# Patient Record
Sex: Male | Born: 1985 | Race: White | Hispanic: No | Marital: Single | Smoking: Never smoker
Health system: Southern US, Community
[De-identification: ages and names within clinical notes are randomized; demographics above are authoritative.]

## PROBLEM LIST (undated history)

## (undated) DIAGNOSIS — F32A Depression, unspecified: Secondary | ICD-10-CM

## (undated) DIAGNOSIS — F329 Major depressive disorder, single episode, unspecified: Secondary | ICD-10-CM

## (undated) HISTORY — DX: Major depressive disorder, single episode, unspecified: F32.9

## (undated) HISTORY — DX: Depression, unspecified: F32.A

---

## 2009-04-27 ENCOUNTER — Ambulatory Visit: Payer: Self-pay | Admitting: Family Medicine

## 2009-05-01 ENCOUNTER — Encounter: Payer: Self-pay | Admitting: Family Medicine

## 2009-05-06 ENCOUNTER — Encounter: Payer: Self-pay | Admitting: Family Medicine

## 2009-06-05 ENCOUNTER — Encounter: Payer: Self-pay | Admitting: Family Medicine

## 2011-09-06 HISTORY — PX: KNEE SURGERY: SHX244

## 2011-12-22 ENCOUNTER — Ambulatory Visit: Payer: Self-pay | Admitting: Specialist

## 2012-03-28 ENCOUNTER — Ambulatory Visit: Payer: Self-pay | Admitting: Specialist

## 2014-12-23 NOTE — Op Note (Signed)
PATIENT NAME:  [name], David R MR#:  Foster DATE OF BIRTH:  [birth year]  DATE OF PROCEDURE:  03/28/2012  PREOPERATIVE DIAGNOSIS: Medial meniscus tear, left knee.   POSTOPERATIVE DIAGNOSIS: Bucket-handle tear medial meniscus, left knee.   PROCEDURE: Arthroscopic partial medial meniscectomy, left knee.   SURGEON: Clare Gandyhristopher E. Reggie Bise, MD  ANESTHESIA: General.   COMPLICATIONS: None.   DESCRIPTION OF PROCEDURE: After adequate induction of general anesthesia, the left lower extremity is placed in the legholder and secured in the usual manner for knee arthroscopy. The left knee and leg are thoroughly prepped with alcohol and ChloraPrep and draped in standard sterile fashion. The joint is infiltrated with 10 mL of Marcaine with epinephrine. Diagnostic arthroscopy is performed. The patellofemoral articulation is normal. The lateral compartment is normal. The anterior cruciate ligament is normal. The pathology is confined to the medial compartment. There is seen to be a bucket-handle type tear of the medial meniscus with the torn portion sitting in the intercondylar notch. The posterior attachment of the bucket handle portion is incised and then the anterior portion is incised. The piece is then grasped with a grasper and removed. The full radial resector and the ArthroWand are then used to smooth off the points of attachment both anteriorly and posteriorly. The joint is thoroughly irrigated multiple times. Skin edges are closed with 4-0 nylon. The joint is infiltrated with 20 mL of Marcaine with epinephrine with 4 mg of morphine. Soft bulky dressing is applied. Patient is returned to the recovery room in satisfactory condition having tolerated the procedure quite well.    ____________________________ Clare Gandyhristopher E. Jess Sulak, MD ces:cms D: 03/28/2012 12:29:33 ET T: 03/28/2012 12:37:33 ET JOB#: [id number] cc: Clare Gandyhristopher E. Yuvraj Pfeifer, MD, <Dictator> Clare GandyHRISTOPHER E Carlous Olivares MD ELECTRONICALLY SIGNED 03/29/2012  14:16

## 2015-02-24 ENCOUNTER — Encounter: Payer: Self-pay | Admitting: Family Medicine

## 2015-03-25 ENCOUNTER — Ambulatory Visit (INDEPENDENT_AMBULATORY_CARE_PROVIDER_SITE_OTHER): Payer: BLUE CROSS/BLUE SHIELD | Admitting: Family Medicine

## 2015-03-25 ENCOUNTER — Ambulatory Visit
Admission: RE | Admit: 2015-03-25 | Discharge: 2015-03-25 | Disposition: A | Discharge status: Home / Self Care | Payer: BLUE CROSS/BLUE SHIELD | Source: Ambulatory Visit | Attending: Family Medicine | Admitting: Family Medicine

## 2015-03-25 ENCOUNTER — Encounter: Payer: Self-pay | Admitting: Family Medicine

## 2015-03-25 VITALS — BP 128/88 | HR 85 | Temp 98.6°F | Resp 16 | Ht 72.0 in | Wt 201.2 lb

## 2015-03-25 DIAGNOSIS — Z0000 Encounter for general adult medical examination without abnormal findings: Secondary | ICD-10-CM | POA: Diagnosis not present

## 2015-03-25 DIAGNOSIS — M545 Low back pain: Secondary | ICD-10-CM | POA: Diagnosis not present

## 2015-03-25 DIAGNOSIS — M542 Cervicalgia: Secondary | ICD-10-CM | POA: Diagnosis not present

## 2015-03-25 DIAGNOSIS — M7918 Myalgia, other site: Secondary | ICD-10-CM | POA: Insufficient documentation

## 2015-03-25 DIAGNOSIS — G8929 Other chronic pain: Secondary | ICD-10-CM | POA: Insufficient documentation

## 2015-03-25 DIAGNOSIS — M546 Pain in thoracic spine: Secondary | ICD-10-CM | POA: Diagnosis not present

## 2015-03-25 MED ORDER — CYCLOBENZAPRINE HCL 5 MG PO TABS: 5.0000 mg | ORAL_TABLET | Freq: Three times a day (TID) | ORAL | Status: DC | PRN

## 2015-03-25 MED ORDER — MELOXICAM 15 MG PO TABS: 15.0000 mg | ORAL_TABLET | Freq: Every day | ORAL | Status: DC

## 2015-03-25 NOTE — Patient Instructions (Signed)
Follow up in one month.

## 2015-03-25 NOTE — Progress Notes (Signed)
Name: David Foster   MRN: [id number]    DOB: February 19, 1986   Date:03/25/2015       Progress Note  Subjective  Chief Complaint  Chief Complaint  Patient presents with  . Annual Exam    Back Pain The current episode started more than 1 month ago. The problem occurs constantly. The problem has been gradually worsening since onset. The pain is present in the thoracic spine, sacro-iliac and lumbar spine (Cervical pain). The quality of the pain is described as aching. The pain is moderate. The pain is worse during the day. The symptoms are aggravated by bending, position and twisting (Lifting). Pertinent negatives include no chest pain, dysuria, fever, headaches, tingling, weakness or weight loss. He has tried analgesics (Chiropractor and PT in the past) for the symptoms.    Patient presents for annual H&P  Past Medical History  Diagnosis Date  . Depression     History  Substance Use Topics  . Smoking status: Never Smoker   . Smokeless tobacco: Not on file  . Alcohol Use: No     Current outpatient prescriptions:  .  saw palmetto 80 MG capsule, Take 80 mg by mouth 2 (two) times daily., Disp: , Rfl:   No Known Allergies  Review of Systems  Constitutional: Negative for fever, chills and weight loss.  HENT: Negative for congestion, hearing loss, sore throat and tinnitus.   Eyes: Negative for blurred vision, double vision and redness.  Respiratory: Negative for cough, hemoptysis and shortness of breath.   Cardiovascular: Negative for chest pain, palpitations, orthopnea, claudication and leg swelling.  Gastrointestinal: Negative for heartburn, nausea, vomiting, diarrhea, constipation and blood in stool.  Genitourinary: Negative for dysuria, urgency, frequency and hematuria.  Musculoskeletal: Positive for back pain. Negative for myalgias, joint pain, falls and neck pain.       Cervical thoracic and lumbar pain which is made worse by lifting at work  Skin: Negative for itching.   Neurological: Negative for dizziness, tingling, tremors, focal weakness, seizures, loss of consciousness, weakness and headaches.  Endo/Heme/Allergies: Does not bruise/bleed easily.  Psychiatric/Behavioral: Negative for depression and substance abuse. The patient is not nervous/anxious and does not have insomnia.      Objective  Filed Vitals:   03/25/15 1013  BP: 128/88  Pulse: 85  Temp: 98.6 F (37 C)  Resp: 16  Height: 6' (1.829 m)  Weight: 201 lb 4 oz (91.286 kg)  SpO2: 97%     Physical Exam  Constitutional: He is oriented to person, place, and time and well-developed, well-nourished, and in no distress.  HENT:  Head: Normocephalic.  Eyes: EOM are normal. Pupils are equal, round, and reactive to light.  Neck: Normal range of motion. Neck supple. No thyromegaly present.  Cardiovascular: Normal rate, regular rhythm and normal heart sounds.   No murmur heard. Pulmonary/Chest: Effort normal and breath sounds normal. No respiratory distress. He has no wheezes.  Abdominal: Soft. Bowel sounds are normal.  Genitourinary: Penis normal.  Musculoskeletal: He exhibits no edema.  Tender along the cervical thoracic and lumbar areas before range of motion sidebending about 45 reproduces some discomfort in the thoracic and lumbar area.  Lymphadenopathy:    He has no cervical adenopathy.  Neurological: He is alert and oriented to person, place, and time. No cranial nerve deficit. Gait normal. Coordination normal.  Skin: Skin is warm and dry. No rash noted.  Psychiatric: Affect and judgment normal.      Assessment & Plan  1. Annual physical  exam  - CBC with Differential/Platelet - Comprehensive metabolic panel - Lipid panel - TSH  2. Cervical pain (neck) Long-standing - DG Cervical Spine Complete; Future - meloxicam (MOBIC) 15 MG tablet; Take 1 tablet (15 mg total) by mouth daily.  Dispense: 30 tablet; Refill: 0 - cyclobenzaprine (FLEXERIL) 5 MG tablet; Take 1 tablet (5 mg  total) by mouth 3 (three) times daily as needed for muscle spasms.  Dispense: 30 tablet; Refill: 1  3. Bilateral thoracic back pain Long-standing  - DG Thoracic Spine W/Swimmers; Future  4. Lumbar muscle pain Long-standing - DG Lumbar Spine Complete; Future - Ambulatory referral to Physical Therapy

## 2015-03-26 ENCOUNTER — Ambulatory Visit
Admission: RE | Admit: 2015-03-26 | Discharge: 2015-03-26 | Disposition: A | Discharge status: Home / Self Care | Payer: BLUE CROSS/BLUE SHIELD | Source: Ambulatory Visit | Attending: Family Medicine | Admitting: Family Medicine

## 2015-03-26 DIAGNOSIS — M545 Low back pain: Secondary | ICD-10-CM | POA: Insufficient documentation

## 2015-03-26 DIAGNOSIS — M542 Cervicalgia: Secondary | ICD-10-CM | POA: Insufficient documentation

## 2015-03-26 DIAGNOSIS — M549 Dorsalgia, unspecified: Secondary | ICD-10-CM | POA: Diagnosis present

## 2015-03-26 DIAGNOSIS — M546 Pain in thoracic spine: Secondary | ICD-10-CM | POA: Insufficient documentation

## 2015-03-26 DIAGNOSIS — M7918 Myalgia, other site: Secondary | ICD-10-CM

## 2015-03-26 LAB — LIPID PANEL
CHOLESTEROL TOTAL: 161 mg/dL (ref 100–199)
Chol/HDL Ratio: 3.8 ratio units (ref 0.0–5.0)
HDL: 42 mg/dL (ref >39)
LDL Calculated: 109 mg/dL — ABNORMAL HIGH (ref 0–99)
TRIGLYCERIDES: 52 mg/dL (ref 0–149)
VLDL Cholesterol Cal: 10 mg/dL (ref 5–40)

## 2015-03-26 LAB — COMPREHENSIVE METABOLIC PANEL
A/G RATIO: 2.4 (ref 1.1–2.5)
ALT: 25 IU/L (ref 0–44)
AST: 21 IU/L (ref 0–40)
Albumin: 4.8 g/dL (ref 3.5–5.5)
Alkaline Phosphatase: 43 IU/L (ref 39–117)
BUN / CREAT RATIO: 16 (ref 8–19)
BUN: 16 mg/dL (ref 6–20)
Bilirubin Total: 0.8 mg/dL (ref 0.0–1.2)
CALCIUM: 9.6 mg/dL (ref 8.7–10.2)
CO2: 26 mmol/L (ref 18–29)
Chloride: 99 mmol/L (ref 97–108)
Creatinine, Ser: 0.98 mg/dL (ref 0.76–1.27)
GFR, EST AFRICAN AMERICAN: 120 mL/min/{1.73_m2} (ref >59)
GFR, EST NON AFRICAN AMERICAN: 104 mL/min/{1.73_m2} (ref >59)
GLOBULIN, TOTAL: 2 g/dL (ref 1.5–4.5)
Glucose: 83 mg/dL (ref 65–99)
POTASSIUM: 4.9 mmol/L (ref 3.5–5.2)
Sodium: 141 mmol/L (ref 134–144)
Total Protein: 6.8 g/dL (ref 6.0–8.5)

## 2015-03-26 LAB — CBC WITH DIFFERENTIAL/PLATELET
BASOS ABS: 0 10*3/uL (ref 0.0–0.2)
Basos: 1 %
EOS (ABSOLUTE): 0.2 10*3/uL (ref 0.0–0.4)
Eos: 3 %
HEMOGLOBIN: 16.1 g/dL (ref 12.6–17.7)
Hematocrit: 46 % (ref 37.5–51.0)
IMMATURE GRANS (ABS): 0 10*3/uL (ref 0.0–0.1)
IMMATURE GRANULOCYTES: 0 %
Lymphocytes Absolute: 1.7 10*3/uL (ref 0.7–3.1)
Lymphs: 34 %
MCH: 30.4 pg (ref 26.6–33.0)
MCHC: 35 g/dL (ref 31.5–35.7)
MCV: 87 fL (ref 79–97)
MONOCYTES: 8 %
Monocytes Absolute: 0.4 10*3/uL (ref 0.1–0.9)
NEUTROS PCT: 54 %
Neutrophils Absolute: 2.7 10*3/uL (ref 1.4–7.0)
Platelets: 223 10*3/uL (ref 150–379)
RBC: 5.3 x10E6/uL (ref 4.14–5.80)
RDW: 13.2 % (ref 12.3–15.4)
WBC: 4.9 10*3/uL (ref 3.4–10.8)

## 2015-03-26 LAB — TSH: TSH: 1.57 u[IU]/mL (ref 0.450–4.500)

## 2015-03-31 ENCOUNTER — Telehealth: Payer: Self-pay | Admitting: Emergency Medicine

## 2015-03-31 NOTE — Telephone Encounter (Signed)
Patient notified

## 2015-03-31 NOTE — Telephone Encounter (Signed)
Patient notified of lab results and will call back in 2 weeks if he feels he needs a referral to Ortho

## 2015-04-01 ENCOUNTER — Encounter: Payer: Self-pay | Admitting: Physical Therapy

## 2015-04-01 ENCOUNTER — Ambulatory Visit: Payer: BLUE CROSS/BLUE SHIELD | Attending: Family Medicine | Admitting: Physical Therapy

## 2015-04-01 DIAGNOSIS — M542 Cervicalgia: Secondary | ICD-10-CM | POA: Insufficient documentation

## 2015-04-01 DIAGNOSIS — R29898 Other symptoms and signs involving the musculoskeletal system: Secondary | ICD-10-CM | POA: Diagnosis not present

## 2015-04-01 DIAGNOSIS — M549 Dorsalgia, unspecified: Secondary | ICD-10-CM

## 2015-04-01 DIAGNOSIS — M546 Pain in thoracic spine: Secondary | ICD-10-CM | POA: Diagnosis not present

## 2015-04-01 NOTE — Patient Instructions (Addendum)
(  Home) Retraction: Row - Bilateral (Anchor)   Facing anchor, arms reaching forward, pull hands toward stomach, pinching shoulder blades together. Repeat _10 ___ times per set. Do __2__ sets per session. Do __5 __ sessions per week. Use _red theraband ___ Copyright  VHI. All rights reserved.  Axial Extension (Chin Tuck)   Pull chin in and lengthen back of neck. Hold __3__ seconds while counting out loud. Repeat __10 __ times. Do _3___ sessions per day.  http://gt2.exer.us/449   Copyright  VHI. All rights reserved.  (Home) Extension / Flexion (Assist)   Face anchor, right arm as far forward and up as is pain free. Pull arm down toward side. Repeat __10__ times per set. Do __2__ sets per session. Do _5___ sessions per week. Use ____ lb weights.  Copyright  VHI. All rights reserved.  CHEST: Doorway, Bilateral - Standing   Standing in doorway, place hands on wall with elbows bent at shoulder height. Lean forward. Hold _10__ seconds. _4__ reps per set, __3_ sets per day, __5_ days per week  Copyright  VHI. All rights reserved.

## 2015-04-01 NOTE — Therapy (Signed)
[facility] MAIN [facility] SERVICES [address] [place], [place], [address] Phone: [phone]   Fax:  [phone]  Physical Therapy Evaluation  Patient Details  Name: David Foster MRN: [id number] Date of Birth: 1986-06-17 Referring Provider:  Dennison Mascot, MD  Encounter Date: 04/01/2015      PT End of Session - 04/01/15 1249    Visit Number 1   Number of Visits 9   Date for PT Re-Evaluation 04/29/15   PT Start Time 1102   PT Stop Time 1200   PT Time Calculation (min) 58 min   Activity Tolerance Patient tolerated treatment well   Behavior During Therapy [facility] for tasks assessed/performed      Past Medical History  Diagnosis Date  . Depression     Past Surgical History  Procedure Laterality Date  . Knee surgery Left 2013    There were no vitals filed for this visit.  Visit Diagnosis:  Neck pain on right side - Plan: PT plan of care cert/re-cert  Upper back pain on right side - Plan: PT plan of care cert/re-cert  Shoulder weakness - Plan: PT plan of care cert/re-cert      Subjective Assessment - 04/01/15 1111    Subjective Patient is a pleasant 29 year old male; reports that he has a history of back problems, usually in low back. This episode, patient has experienced upper thoracic pain and neck pain, states that he received chiropractic treatment about 3 weeks ago. Since chiropractic treatment, pain has decreased, but still problematic, especially at the end of the day when he is reaching out and overhead.   Pertinent History History of low back pain. Family history of back problems in father.    Limitations Lifting;Walking   How long can you sit comfortably? As long as needed   How long can you stand comfortably? as long as needed, provided no heavy lifting.    How long can you walk comfortably? as long as needed, provided no heavy lifting.    Diagnostic tests X-ray. Mild degenerative changes, no other abnormalities noted.     Patient Stated Goals reduce pain, increase function at work.    Currently in Pain? Yes   Pain Score 1    Pain Location Back   Pain Descriptors / Indicators Aching;Stabbing;Sore   Pain Type Chronic pain   Pain Radiating Towards n/a   Pain Onset More than a month ago   Pain Frequency Intermittent   Aggravating Factors  Lifting out or overhead , turning head to right    Pain Relieving Factors rest, sleeping on back or side   Effect of Pain on Daily Activities Reduced function at end of day    Multiple Pain Sites No            [facility] PT Assessment - 04/02/15 0001    Assessment   Medical Diagnosis Thoracic back pain    Onset Date/Surgical Date 04/01/15   Hand Dominance Right   Prior Therapy PT for lower back problems in approx 2009; good results from that treatment; Chiropractor treatment 7/12, some relief    Home Environment   Living Environment Private residence   Living Arrangements Alone   Available Help at Discharge Friend(s);Family   Type of Home House   Home Access Stairs to enter   Entrance Stairs-Number of Steps 4   Entrance Stairs-Rails Right;Left;Can reach both   Home Layout One level   Prior Function   Level of Independence Independent  Vocation Full time employment  Working full time at [employer] and part time [employer]    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 Also sits in flexed poster when not instructed for erect posture.    AROM   Overall AROM Comments Cervical flexion 45: cervical extension: 55:  R cervical sidebend 45, L cervical sidebend 50.  All UE motion within normal limits, no winging of scapula with AROM. Gross thoracic and lumbar extension mild reduced motion.    Strength   Overall Strength Comments R shoulder flexion 4+/5 with winging scapula noted, winging not present on L. decreased strength with resisted rotation to L. R IR 5/5 with winging scapula. R rhomboid strength 4/5, R low trapezius 4/5. L rhomboid 4+/5, L low trap 4+/5. All other UE motions 5/5    Palpation   Palpation comment reports increased pain with palpation to Rhomboids, and to PA spinal mobilizations C6-T6, most tender C7 and T4, tenderness also noted with bilateral rotational mobs T1-T5   Spurling's   Findings Positive   Side Right   Comment Pain in R T2-T3 facet region, no sensation reported in arm of shoulder.    Ambulation/Gait   Gait Comments patient ambulates with normal reciprocal gait pattern, but forward head noted .           Treatment :   Educated in [employer] tucks 2x10  Shoulder extension red tband  x10  low row x10  PT provided Verbal instruction to increase scapular retraction and prevent shoulder shrug, as well as to prevent cervical extension with chin tucks.               PT Education - 04/01/15 1604    Education provided Yes   Education Details Plan of care, HEP - see patient instructions    Person(s) Educated Patient   Methods Explanation;Demonstration;Verbal cues   Comprehension Verbalized understanding;Returned demonstration;Verbal cues required             PT Long Term Goals - 04/01/15 1609    PT LONG TERM GOAL #1   Title Patient will be independent with HEP to improve strength and decrease pain at work by 04/29/2015   Time 4   Period Weeks   Status New   PT LONG TERM GOAL #2   Title Improve Modified ODI by > 6 points to indicate decreased effect of back pain on daily life as well as improved function by 04/29/2015   Baseline 18   Time 4   Period  Weeks   Status New   PT LONG TERM GOAL #3   Title Patient will increase all upper extremity strengthening to 5/5 to allow greater function at work and decreased pain by 04/29/2015   Time 4   Period Weeks   Status New   PT LONG TERM GOAL #4   Title Patient will be able to move box up to 100 lbs without increase in pain to allow improved function at work by 04/29/2015   Time 4   Period Weeks   Status  New   PT LONG TERM GOAL #5   Title Patient will demonstrate full equal bilateral thoracic and cervical ROM to allow improved function to move heavy boxes at work by 04/29/2015   Time 4   Period Weeks   Status New               Plan - 04/01/15 1249    Clinical Impression Statement Patient is a pleasant 29 year old male with complaints of upper back pain. Patient demonstrates slight impairment due to back pain as evidenced by 18%(mild impairment) on the modified ODI. Through evaluation patient found to have decreased strength in parascapular musculature and resisted trunk rotation to L. Increase pain noted in R paraspinal/T2-T3 facet region with cervical rotation as well as middle trapezius MMT, shoulder flexion MMT and rhomboid MMT. Patient also reports increased pain in R rhomboid region with PA and rotational mobs to lower cervical spine and upper/Mid thoracic spine, as well as hypomobililty from C7-T5. Trigger point noted in R rhomboid region. Positive spurling test, but no pain distal upper back. Patient also demonstrates poor postural control in sitting and standing with forward head and rounded shoulders. Patient would benefit from skilled PT in order to reduce pain and increase function at work.   Pt will benefit from skilled therapeutic intervention in order to improve on the following deficits Hypomobility;Decreased activity tolerance;Decreased range of motion;Decreased strength;Postural dysfunction;Improper body mechanics   Rehab Potential Excellent   Clinical Impairments Affecting  Rehab Potential Positive: young in age, High prior level of function and recent onset. Negative, poor posture, recurrent back problems   PT Frequency 2x / week   PT Duration 4 weeks   PT Treatment/Interventions Cryotherapy;Moist Heat;Traction;Ultrasound;Electrical Stimulation;Stair training;Functional mobility training;Therapeutic exercise;Patient/family education;Manual techniques;Passive range of motion;Energy conservation;Therapeutic activities;Dry needling;Taping   PT Next Visit Plan cervical ROM postural exercises, manual therapy    PT Home Exercise Plan see patient instructions.    Consulted and Agree with Plan of Care Patient         Problem List Patient Active Problem List   Diagnosis Date Noted  . Annual physical exam 03/25/2015  . Cervical pain (neck) 03/25/2015  . Lumbar muscle pain 03/25/2015  . Bilateral thoracic back pain 03/25/2015   Grier Rocher SPT 04/02/2015   10:13 AM  This entire session was performed under direct supervision and direction of a licensed therapist . I have personally read, edited and approve of the note as written.  Hopkins,Margaret, PT, DPT 04/02/2015, 10:13 AM  [facility] MAIN [facility] SERVICES [address] [place], [place], [address] Phone: [phone]   Fax:  [phone]

## 2015-04-08 ENCOUNTER — Encounter: Payer: Self-pay | Admitting: Physical Therapy

## 2015-04-08 ENCOUNTER — Ambulatory Visit: Payer: BLUE CROSS/BLUE SHIELD | Attending: Family Medicine | Admitting: Physical Therapy

## 2015-04-08 DIAGNOSIS — M542 Cervicalgia: Secondary | ICD-10-CM | POA: Diagnosis not present

## 2015-04-08 DIAGNOSIS — M546 Pain in thoracic spine: Secondary | ICD-10-CM | POA: Diagnosis present

## 2015-04-08 DIAGNOSIS — R29898 Other symptoms and signs involving the musculoskeletal system: Secondary | ICD-10-CM | POA: Diagnosis present

## 2015-04-08 DIAGNOSIS — M549 Dorsalgia, unspecified: Secondary | ICD-10-CM

## 2015-04-08 NOTE — Patient Instructions (Signed)
  Shoulder Retraction   Facing chest height anchor, grasp ends of band and pull hands to chest, squeezing shoulder blades together. Hold __2_ seconds. Repeat 10___ times. Do _2__ sessions per day. Safety Note: Be sure anchor is secure.  Copyright  VHI. All rights reserved.  Shoulder Extension (Sit Box)   Tie red tband around doorknob. Holding band in each hand, pull both arms back, squeezing shoulder blades. Repeat 10 times, 2sets x5 days a week;  Copyright  VHI. All rights reserved.  Copyright  VHI. All rights reserved.  Reverse Fly / Shoulder Retraction  Use red band Extend both arms in front of body at shoulder height, palms down, holding band. Move arms out to sides, squeeze shoulder blades together. Repeat _10__ times. Do _2__ sessions per day.   Copyright  VHI. All rights reserved.   Roll   Inhale and bring shoulders up, back, then exhale and relax shoulders down. Repeat _10__ times. Do _2__ times per day.  Copyright  VHI. All rights reserved.

## 2015-04-08 NOTE — Therapy (Signed)
[facility] MAIN [facility] SERVICES [address] [place], [place], [address] Phone: [phone]   Fax:  [phone]  Physical Therapy Treatment  Patient Details  Name: David Foster MRN: [id number] Date of Birth: 08-17-1986 Referring Provider:  Dennison Mascot, MD  Encounter Date: 04/08/2015      PT End of Session - 04/08/15 1244    Visit Number 2   Number of Visits 9   Date for PT Re-Evaluation 04/29/15   PT Start Time 1116   PT Stop Time 1200   PT Time Calculation (min) 44 min   Activity Tolerance Patient tolerated treatment well;No increased pain   Behavior During Therapy [facility] for tasks assessed/performed      Past Medical History  Diagnosis Date  . Depression     Past Surgical History  Procedure Laterality Date  . Knee surgery Left 2013    There were no vitals filed for this visit.  Visit Diagnosis:  Neck pain on right side  Upper back pain on right side  Shoulder weakness      Subjective Assessment - 04/08/15 1121    Subjective Patient reports that he lost his exercise sheet from last time. He has been trying to sit up straighter which has been "hurting" his back, but he feels it is a good hurt. Patient reports 5/10 back pain today;    Pertinent History History of low back pain. Family history of back problems in father.    Limitations Lifting;Walking   How long can you sit comfortably? As long as needed   How long can you stand comfortably? as long as needed, provided no heavy lifting.    How long can you walk comfortably? as long as needed, provided no heavy lifting.    Diagnostic tests X-ray. Mild degenerative changes, no other abnormalities noted.    Patient Stated Goals reduce pain, increase function at work.    Currently in Pain? Yes   Pain Score 5    Pain Location Back   Pain Orientation Upper   Pain Descriptors / Indicators Aching   Pain Type Chronic pain   Pain Radiating Towards neck   Pain Onset More than  a month ago   Pain Frequency Intermittent   Aggravating Factors  sitting, erect posture, movement   Pain Relieving Factors rest   Effect of Pain on Daily Activities reduced activity tolerance      TREATMENT: Warm up on UBE, BUE backwards level 2 x3 min (Unbilled);  Red tband: BUE shoulder extension 2x10; BUE row 2x10; BUE horizontal abduction 2x10;   Patient required min-moderate verbal/tactile cues for correct exercise technique including cues to avoid shoulder elevation and increase scapular retraction for increased strengthening;  Prone with 2# handweight: BUE shoulder extension x10; BUE horizontal abduction x10; Alternate UE flexion x10 bilaterally;  PT performed extensive manual therapy to patient in prone: Grade II-III PA mobs T3, T4, T5 10 sec bouts x5 each; Soft/deep tissue massage to right scapular paraspinals, including ASTYM with edge tool to reduce trigger points and increase myofascial release; Patient reports less pain after manual therapy;                          PT Education - 04/08/15 1243    Education provided Yes   Education Details exercise, plan of care, HEP- see patient instructions   Person(s) Educated Patient   Methods Explanation;Demonstration;Verbal cues;Handout   Comprehension Verbalized understanding;Returned demonstration;Verbal cues required  PT Long Term Goals - 04/01/15 1609    PT LONG TERM GOAL #1   Title Patient will be independent with HEP to improve strength and decrease pain at work by 04/29/2015   Time 4   Period Weeks   Status New   PT LONG TERM GOAL #2   Title Improve Modified ODI by > 6 points to indicate decreased effect of back pain on daily life as well as improved function by 04/29/2015   Baseline 18   Time 4   Period Weeks   Status New   PT LONG TERM GOAL #3   Title Patient will increase all upper extremity strengthening to 5/5 to allow greater function at work and decreased pain by  04/29/2015   Time 4   Period Weeks   Status New   PT LONG TERM GOAL #4   Title Patient will be able to move box up to 100 lbs without increase in pain to allow improved function at work by 04/29/2015   Time 4   Period Weeks   Status New   PT LONG TERM GOAL #5   Title Patient will demonstrate full equal bilateral thoracic and cervical ROM to allow improved function to move heavy boxes at work by 04/29/2015   Time 4   Period Weeks   Status New               Plan - 04/08/15 1244    Clinical Impression Statement Patient was instructed in postural strengthening exercise. He required increased cues to avoid shoulder elevation during scapular retraction. patient responded well to cues. He reports no pain following exercise. PT performed joint mobs to thoracic spine for increased spinal mobility. Also perform manual therapy to reduce tightness/pain in upper back. Patient noted to have trigger point along right scapular paraspinals. He responded well to manual therapy with less tenderness after treatment session. He would benefit from additional skilled PT intervention to improve posture and reduce back pain;    Pt will benefit from skilled therapeutic intervention in order to improve on the following deficits Hypomobility;Decreased activity tolerance;Decreased range of motion;Decreased strength;Postural dysfunction;Improper body mechanics   Rehab Potential Excellent   Clinical Impairments Affecting Rehab Potential Positive: young in age, High prior level of function and recent onset. Negative, poor posture, recurrent back problems   PT Frequency 2x / week   PT Duration 4 weeks   PT Treatment/Interventions Cryotherapy;Moist Heat;Traction;Ultrasound;Electrical Stimulation;Stair training;Functional mobility training;Therapeutic exercise;Patient/family education;Manual techniques;Passive range of motion;Energy conservation;Therapeutic activities;Dry needling;Taping   PT Next Visit Plan cervical ROM  postural exercises, manual therapy    PT Home Exercise Plan see patient instructions.    Consulted and Agree with Plan of Care Patient        Problem List Patient Active Problem List   Diagnosis Date Noted  . Annual physical exam 03/25/2015  . Cervical pain (neck) 03/25/2015  . Lumbar muscle pain 03/25/2015  . Bilateral thoracic back pain 03/25/2015    Hopkins,Terrilyn Tyner 04/08/2015, 12:47 PM  [place] [facility] MAIN [facility] SERVICES [address] [place], [place], [address] Phone: [phone]   Fax:  [phone]

## 2015-04-15 ENCOUNTER — Ambulatory Visit: Payer: BLUE CROSS/BLUE SHIELD | Admitting: Physical Therapy

## 2015-04-15 ENCOUNTER — Encounter: Payer: Self-pay | Admitting: Physical Therapy

## 2015-04-15 DIAGNOSIS — M542 Cervicalgia: Secondary | ICD-10-CM | POA: Diagnosis not present

## 2015-04-15 DIAGNOSIS — M549 Dorsalgia, unspecified: Secondary | ICD-10-CM

## 2015-04-15 DIAGNOSIS — R29898 Other symptoms and signs involving the musculoskeletal system: Secondary | ICD-10-CM

## 2015-04-15 NOTE — Therapy (Signed)
[facility] MAIN [facility] SERVICES [address] [place], [place], [address] Phone: [phone]   Fax:  [phone]  Physical Therapy Treatment  Patient Details  Name: David Foster MRN: [id number] Date of Birth: [birth year] Referring Provider:  Dennison Mascot, MD  Encounter Date: 04/15/2015      PT End of Session - 04/15/15 1034    Visit Number 3   Number of Visits 9   Date for PT Re-Evaluation 04/29/15   PT Start Time 0940   PT Stop Time 1016   PT Time Calculation (min) 36 min   Activity Tolerance Patient tolerated treatment well;No increased pain   Behavior During Therapy [facility] for tasks assessed/performed      Past Medical History  Diagnosis Date  . Depression     Past Surgical History  Procedure Laterality Date  . Knee surgery Left 2013    There were no vitals filed for this visit.  Visit Diagnosis:  Neck pain on right side  Upper back pain on right side  Shoulder weakness      Subjective Assessment - 04/15/15 0945    Subjective Patient reports doing really well this week. He reports less fatigue with better posture. Reports compliance with HEP. Reports less pain overall. Patient is tolerating exercises well.    Pertinent History History of low back pain. Family history of back problems in father.    Limitations Lifting;Walking   How long can you sit comfortably? As long as needed   How long can you stand comfortably? as long as needed, provided no heavy lifting.    How long can you walk comfortably? as long as needed, provided no heavy lifting.    Diagnostic tests X-ray. Mild degenerative changes, no other abnormalities noted.    Patient Stated Goals reduce pain, increase function at work.    Currently in Pain? No/denies   Pain Onset More than a month ago        TREATMENT: Warm up on UBE, BUE backwards level 2 x3 min (Unbilled);  Green tband: BUE horizontal abduction 2x10; BUE shoulder protraction  2x10;  Doorway pec minor stretch (arms overhead) 2x20 sec hold;  HOIST mid row, plate #2, O96, plate #3, E95; HOIST lat pull down plate #3, 2W41;   Supine over 1/2 bolster (along spinous processes) x1 min, also with BUE shoulder abduction x10;  Patient required mod VCs during weight machine exercise to increase scapular retraction and to avoid painful ROM. Patient exhibits increased muscle fasciculation with additional repetition due to fatigue. He required min Vcs to slow down eccentric return for better muscle control. Patient required mod VCs during tband exercise for better positioning including to avoid elbow flexion during shoulder protraction/retraction. He was able to demonstrate better flexibility with less discomfort during tband exercise.   PT performed extensive manual therapy to patient in prone: Grade III PA mobs T4, 15 sec bouts x3; grade III PA mobs to left transverse process at T4 to facilitate better rotation, 10 sec bouts x4; Patient able to demonstrate better joint mobility with less pain following manual therapy; PT assessed paraspinals and noted no trigger points or tightness as compared to last session;                                   PT Education - 04/15/15 1034    Education provided Yes   Education Details exercise, posture   Person(s) Educated  Patient   Methods Explanation;Verbal cues   Comprehension Verbalized understanding;Verbal cues required;Returned demonstration             PT Long Term Goals - 04/01/15 1609    PT LONG TERM GOAL #1   Title Patient will be independent with HEP to improve strength and decrease pain at work by 04/29/2015   Time 4   Period Weeks   Status New   PT LONG TERM GOAL #2   Title Improve Modified ODI by > 6 points to indicate decreased effect of back pain on daily life as well as improved function by 04/29/2015   Baseline 18   Time 4   Period Weeks   Status New   PT LONG TERM GOAL #3    Title Patient will increase all upper extremity strengthening to 5/5 to allow greater function at work and decreased pain by 04/29/2015   Time 4   Period Weeks   Status New   PT LONG TERM GOAL #4   Title Patient will be able to move box up to 100 lbs without increase in pain to allow improved function at work by 04/29/2015   Time 4   Period Weeks   Status New   PT LONG TERM GOAL #5   Title Patient will demonstrate full equal bilateral thoracic and cervical ROM to allow improved function to move heavy boxes at work by 04/29/2015   Time 4   Period Weeks   Status New               Plan - 04/15/15 1041    Clinical Impression Statement Patient was late to today's session. He reports compliance with HEP. PT focused on postural strengthening. Advanced strengthening with resisted weight machines. Also added scapular protraction to improve scapular stability. Patient continues to have tightness along T4. Patient responded well to manual therapy with less joint stiffness and less pain after treatment session. He would benefit from additional skilled PT intervention to reduce stiffness and improve postural strength.    Pt will benefit from skilled therapeutic intervention in order to improve on the following deficits Hypomobility;Decreased activity tolerance;Decreased range of motion;Decreased strength;Postural dysfunction;Improper body mechanics   Rehab Potential Excellent   Clinical Impairments Affecting Rehab Potential Positive: young in age, High prior level of function and recent onset. Negative, poor posture, recurrent back problems   PT Frequency 2x / week   PT Duration 4 weeks   PT Treatment/Interventions Cryotherapy;Moist Heat;Traction;Ultrasound;Electrical Stimulation;Stair training;Functional mobility training;Therapeutic exercise;Patient/family education;Manual techniques;Passive range of motion;Energy conservation;Therapeutic activities;Dry needling;Taping   PT Next Visit Plan cervical  ROM postural exercises, manual therapy    PT Home Exercise Plan continue as previously given   Consulted and Agree with Plan of Care Patient        Problem List Patient Active Problem List   Diagnosis Date Noted  . Annual physical exam 03/25/2015  . Cervical pain (neck) 03/25/2015  . Lumbar muscle pain 03/25/2015  . Bilateral thoracic back pain 03/25/2015    Hopkins,Tawonda Legaspi, PT, DPT 04/15/2015, 10:45 AM  [facility] MAIN [facility] SERVICES [address] [place], [place], [address] Phone: [phone]   Fax:  [phone]

## 2015-04-20 ENCOUNTER — Ambulatory Visit: Payer: BLUE CROSS/BLUE SHIELD | Admitting: Physical Therapy

## 2015-04-20 ENCOUNTER — Encounter: Payer: Self-pay | Admitting: Physical Therapy

## 2015-04-20 DIAGNOSIS — M549 Dorsalgia, unspecified: Secondary | ICD-10-CM

## 2015-04-20 DIAGNOSIS — R29898 Other symptoms and signs involving the musculoskeletal system: Secondary | ICD-10-CM

## 2015-04-20 DIAGNOSIS — M542 Cervicalgia: Secondary | ICD-10-CM | POA: Diagnosis not present

## 2015-04-20 NOTE — Therapy (Signed)
[place] [facility] MAIN [facility] SERVICES [address] [place], [place], [address] Phone: [phone]   Fax:  [phone]  Physical Therapy Treatment  Patient Details  Name: David Foster MRN: [id number] Date of Birth: 10-Feb-1986 Referring Provider:  Dennison Mascot, MD  Encounter Date: 04/20/2015      PT End of Session - 04/20/15 1305    Visit Number 4   Number of Visits 9   Date for PT Re-Evaluation 04/29/15   PT Start Time 1120   PT Stop Time 1202   PT Time Calculation (min) 42 min   Activity Tolerance Patient tolerated treatment well;No increased pain   Behavior During Therapy [facility] for tasks assessed/performed      Past Medical History  Diagnosis Date  . Depression     Past Surgical History  Procedure Laterality Date  . Knee surgery Left 2013    There were no vitals filed for this visit.  Visit Diagnosis:  Neck pain on right side  Upper back pain on right side  Shoulder weakness      Subjective Assessment - 04/20/15 1122    Subjective Patient reports feeling more "inflammed" over the weekend due to moving things and working harder. Still compliant with HEP;    Pertinent History History of low back pain. Family history of back problems in father.    Limitations Lifting;Walking   How long can you sit comfortably? As long as needed   How long can you stand comfortably? as long as needed, provided no heavy lifting.    How long can you walk comfortably? as long as needed, provided no heavy lifting.    Diagnostic tests X-ray. Mild degenerative changes, no other abnormalities noted.    Patient Stated Goals reduce pain, increase function at work.    Currently in Pain? Yes   Pain Score 1    Pain Location Back   Pain Orientation Upper   Pain Descriptors / Indicators Aching;Tightness   Pain Type Chronic pain   Pain Radiating Towards neck   Pain Onset More than a month ago   Pain Frequency Intermittent   Aggravating Factors   lifting, work tasks   Pain Relieving Factors rest   Effect of Pain on Daily Activities reduced activity tolerance          TREATMENT: Warm up on UBE, BUE backwards level 2 x3 min (Unbilled);  Red tband: BUE horizontal abduction x10; BUE shoulder diagonals  2x10;  Doorway pec minor stretch (arms overhead) 2x20 sec hold;  HOIST mid row, plate #3, 1L24; HOIST lat pull down plate #3, 4W10;  Patient required mod VCs during weight machine exercise to increase scapular retraction and to avoid painful ROM. Patient exhibits increased muscle fasciculation with additional repetition due to fatigue. He required min Vcs to slow down eccentric return for better muscle control. Patient required mod VCs during tband exercise for better positioning including to avoid elbow flexion during shoulder horizontal abduction/diagonals. He required mod VCs to increase scapular retraction during diagonals for better thoracic strengthening;  PT re-educated patient in correct lifting techniques, floor to wait with instruction to increase weight shift and moving feet more for less flexion/rotation in order to reduce back pain;  PT applied TENs, IFC setting at tolerated intensity to patient's thoracic parasipnals, x15 min in prone concurrent with cryotherapy to reduce discomfort . Patient responded well with no pain reported after treatment session and no redness or skin irritation noted.  PT Education - 04/20/15 1305    Education provided Yes   Education Details exercise, posture   Person(s) Educated Patient   Methods Explanation;Verbal cues   Comprehension Verbalized understanding;Returned demonstration;Verbal cues required             PT Long Term Goals - 04/01/15 1609    PT LONG TERM GOAL #1   Title Patient will be independent with HEP to improve strength and decrease pain at work by 04/29/2015   Time 4   Period Weeks   Status New   PT LONG TERM GOAL #2    Title Improve Modified ODI by > 6 points to indicate decreased effect of back pain on daily life as well as improved function by 04/29/2015   Baseline 18   Time 4   Period Weeks   Status New   PT LONG TERM GOAL #3   Title Patient will increase all upper extremity strengthening to 5/5 to allow greater function at work and decreased pain by 04/29/2015   Time 4   Period Weeks   Status New   PT LONG TERM GOAL #4   Title Patient will be able to move box up to 100 lbs without increase in pain to allow improved function at work by 04/29/2015   Time 4   Period Weeks   Status New   PT LONG TERM GOAL #5   Title Patient will demonstrate full equal bilateral thoracic and cervical ROM to allow improved function to move heavy boxes at work by 04/29/2015   Time 4   Period Weeks   Status New               Plan - 04/20/15 1329    Clinical Impression Statement Patient reports having increased soreness today and over the weekend from excessive lifting at work. PT re-educated patient on lifting techniques with cues to avoid trunk flexion and rotation. Also advanced postural strengthening with instruction in tband exercise. Patient resopnded well to TENs with no pain after treatment session. He would benefit from additional skilled PT Intervention to reduce pain and improve tolerance with work tasks.    Pt will benefit from skilled therapeutic intervention in order to improve on the following deficits Hypomobility;Decreased activity tolerance;Decreased range of motion;Decreased strength;Postural dysfunction;Improper body mechanics   Rehab Potential Excellent   Clinical Impairments Affecting Rehab Potential Positive: young in age, High prior level of function and recent onset. Negative, poor posture, recurrent back problems   PT Frequency 2x / week   PT Duration 4 weeks   PT Treatment/Interventions Cryotherapy;Moist Heat;Traction;Ultrasound;Electrical Stimulation;Stair training;Functional mobility  training;Therapeutic exercise;Patient/family education;Manual techniques;Passive range of motion;Energy conservation;Therapeutic activities;Dry needling;Taping   PT Next Visit Plan cervical ROM postural exercises, manual therapy    PT Home Exercise Plan continue as previously given   Consulted and Agree with Plan of Care Patient        Problem List Patient Active Problem List   Diagnosis Date Noted  . Annual physical exam 03/25/2015  . Cervical pain (neck) 03/25/2015  . Lumbar muscle pain 03/25/2015  . Bilateral thoracic back pain 03/25/2015    Hopkins,Margaret, PT, DPT 04/20/2015, 1:31 PM  [place] [facility] MAIN [facility] SERVICES [address] [place], [place], [address] Phone: [phone]   Fax:  [phone]

## 2015-04-22 ENCOUNTER — Encounter: Payer: Self-pay | Admitting: Physical Therapy

## 2015-04-22 ENCOUNTER — Ambulatory Visit: Payer: BLUE CROSS/BLUE SHIELD | Admitting: Physical Therapy

## 2015-04-22 DIAGNOSIS — M549 Dorsalgia, unspecified: Secondary | ICD-10-CM

## 2015-04-22 DIAGNOSIS — R29898 Other symptoms and signs involving the musculoskeletal system: Secondary | ICD-10-CM

## 2015-04-22 DIAGNOSIS — M542 Cervicalgia: Secondary | ICD-10-CM | POA: Diagnosis not present

## 2015-04-22 NOTE — Therapy (Signed)
[facility] [facility] MAIN [facility] SERVICES [address] [place], [place], [address] Phone: [phone]   Fax:  [phone]  Physical Therapy Treatment  Patient Details  Name: David Foster MRN: [id number] Date of Birth: 1985/11/20 Referring Provider:  Dennison Mascot, MD  Encounter Date: 04/22/2015      PT End of Session - 04/22/15 1153    Visit Number 5   Number of Visits 9   Date for PT Re-Evaluation 04/29/15   PT Start Time 1125   PT Stop Time 1205   PT Time Calculation (min) 40 min   Activity Tolerance Patient tolerated treatment well;No increased pain   Behavior During Therapy [facility] for tasks assessed/performed      Past Medical History  Diagnosis Date  . Depression     Past Surgical History  Procedure Laterality Date  . Knee surgery Left 2013    There were no vitals filed for this visit.  Visit Diagnosis:  Neck pain on right side  Upper back pain on right side  Shoulder weakness      Subjective Assessment - 04/22/15 1127    Subjective Patient reports continuing to have "Inflammation" in upper back; He repots still having to do a lot of lifting/moving at work.    Pertinent History History of low back pain. Family history of back problems in father.    Limitations Lifting;Walking   How long can you sit comfortably? As long as needed   How long can you stand comfortably? as long as needed, provided no heavy lifting.    How long can you walk comfortably? as long as needed, provided no heavy lifting.    Diagnostic tests X-ray. Mild degenerative changes, no other abnormalities noted.    Patient Stated Goals reduce pain, increase function at work.    Currently in Pain? Yes   Pain Score 2    Pain Location Back   Pain Orientation Upper   Pain Descriptors / Indicators --  swelling, dull burning   Pain Type Chronic pain   Pain Radiating Towards neck    Pain Onset More than a month ago   Pain Frequency Intermittent   Aggravating  Factors  lifting, work tasks   Pain Relieving Factors rest   Effect of Pain on Daily Activities reduced activity tolerance          TREATMENT: Warm up on UBE, BUE backwards level 2 x3 min (Unbilled);  Green tband: BUE horizontal abduction x10; BUE shoulder diagonals 2x10; BUE rows x10; BUE shoulder extension x10; BUE lat pull down x10;  HOIST mid row, plate #3, 5I43; HOIST lat pull down plate #3, 3I95;  Qped cat/camel stretch 5 sec hold x3 each;   Patient required mod VCs during weight machine exercise to increase scapular retraction and to avoid painful ROM. He required min Vcs to slow down eccentric return for better muscle control. Patient required mod VCs during tband exercise for better positioning including to avoid shoulder elevation during shoulder horizontal abduction/diagonals. He required mod VCs to increase scapular retraction during diagonals for better thoracic strengthening;     PT applied TENs, IFC setting at tolerated intensity to patient's thoracic parasipnals, x15 min in prone concurrent with cryotherapy to reduce discomfort . Patient responded well with no pain reported after treatment session and no redness or skin irritation noted.                           PT Education -  04/22/15 1152    Education provided Yes   Education Details exercise, HEP progression   Person(s) Educated Patient   Methods Explanation;Verbal cues;Handout   Comprehension Verbalized understanding;Returned demonstration;Verbal cues required             PT Long Term Goals - 04/01/15 1609    PT LONG TERM GOAL #1   Title Patient will be independent with HEP to improve strength and decrease pain at work by 04/29/2015   Time 4   Period Weeks   Status New   PT LONG TERM GOAL #2   Title Improve Modified ODI by > 6 points to indicate decreased effect of back pain on daily life as well as improved function by 04/29/2015   Baseline 18   Time 4   Period  Weeks   Status New   PT LONG TERM GOAL #3   Title Patient will increase all upper extremity strengthening to 5/5 to allow greater function at work and decreased pain by 04/29/2015   Time 4   Period Weeks   Status New   PT LONG TERM GOAL #4   Title Patient will be able to move box up to 100 lbs without increase in pain to allow improved function at work by 04/29/2015   Time 4   Period Weeks   Status New   PT LONG TERM GOAL #5   Title Patient will demonstrate full equal bilateral thoracic and cervical ROM to allow improved function to move heavy boxes at work by 04/29/2015   Time 4   Period Weeks   Status New               Plan - 04/22/15 1522    Clinical Impression Statement Instructed patient in advanced postural strengthening. Advanced HEP with green tband for resistance exercise to progress strengthening. Patient responded well to cues demonstrating better scapular retraction. He denies any increase in pain with exercise. PT applied IFC TENs to patients thoracic paraspinals concurrent with cyrotherapy in prone to reduce discomfort. Patient denies any pain after treatment session. He would benefit from additional skilled PT intervention to reduce pain and return to PLOF.   Pt will benefit from skilled therapeutic intervention in order to improve on the following deficits Hypomobility;Decreased activity tolerance;Decreased range of motion;Decreased strength;Postural dysfunction;Improper body mechanics   Rehab Potential Excellent   Clinical Impairments Affecting Rehab Potential Positive: young in age, High prior level of function and recent onset. Negative, poor posture, recurrent back problems   PT Frequency 2x / week   PT Duration 4 weeks   PT Treatment/Interventions Cryotherapy;Moist Heat;Traction;Ultrasound;Electrical Stimulation;Stair training;Functional mobility training;Therapeutic exercise;Patient/family education;Manual techniques;Passive range of motion;Energy  conservation;Therapeutic activities;Dry needling;Taping   PT Next Visit Plan cervical ROM postural exercises, manual therapy    PT Home Exercise Plan continue as previously given   Consulted and Agree with Plan of Care Patient        Problem List Patient Active Problem List   Diagnosis Date Noted  . Annual physical exam 03/25/2015  . Cervical pain (neck) 03/25/2015  . Lumbar muscle pain 03/25/2015  . Bilateral thoracic back pain 03/25/2015    Hopkins,MargaretPT, DPT 04/22/2015, 3:27 PM  [facility] MAIN [facility] SERVICES [address] [place], [place], [address] Phone: [phone]   Fax:  [phone]

## 2015-04-22 NOTE — Patient Instructions (Signed)
Spinal Mobility (Cat / Camel): Flexion / Extension   Kneeling on hands/knees: Round your back, pushing through your hands, looking down holding for 5 sec, then arch your back and look up towards ceiling, hold for 5 sec; Repeat 3-5 times each direction feeling stretch in upper back;  Copyright  VHI. All rights reserved.  (Home) Stabilization: Lat Pull - Standing   Standing facing door, have green band looped over doorway, hold end of band in each hand, and pull arms down and out in low "V" position.  Repeat 10 times, do 2 sets each time, 2 times a day Copyright  VHI. All rights reserved.

## 2015-04-27 ENCOUNTER — Ambulatory Visit: Payer: BLUE CROSS/BLUE SHIELD | Admitting: Family Medicine

## 2015-04-27 ENCOUNTER — Encounter: Payer: Self-pay | Admitting: Physical Therapy

## 2015-04-27 ENCOUNTER — Ambulatory Visit: Payer: BLUE CROSS/BLUE SHIELD | Admitting: Physical Therapy

## 2015-04-27 ENCOUNTER — Ambulatory Visit (INDEPENDENT_AMBULATORY_CARE_PROVIDER_SITE_OTHER): Payer: BLUE CROSS/BLUE SHIELD | Admitting: Family Medicine

## 2015-04-27 ENCOUNTER — Encounter: Payer: Self-pay | Admitting: Family Medicine

## 2015-04-27 DIAGNOSIS — M542 Cervicalgia: Secondary | ICD-10-CM | POA: Diagnosis not present

## 2015-04-27 DIAGNOSIS — M549 Dorsalgia, unspecified: Secondary | ICD-10-CM

## 2015-04-27 DIAGNOSIS — M7918 Myalgia, other site: Secondary | ICD-10-CM

## 2015-04-27 DIAGNOSIS — M546 Pain in thoracic spine: Secondary | ICD-10-CM | POA: Diagnosis not present

## 2015-04-27 DIAGNOSIS — M545 Low back pain: Secondary | ICD-10-CM

## 2015-04-27 DIAGNOSIS — R29898 Other symptoms and signs involving the musculoskeletal system: Secondary | ICD-10-CM

## 2015-04-27 MED ORDER — GABAPENTIN 100 MG PO CAPS: 100.0000 mg | ORAL_CAPSULE | Freq: Three times a day (TID) | ORAL | Status: DC

## 2015-04-27 NOTE — Patient Instructions (Signed)
Referral to sports medicine physician

## 2015-04-27 NOTE — Progress Notes (Signed)
Name: David Foster   MRN: [id number]    DOB: [birth year]   Date:04/27/2015       Progress Note  Subjective  Chief Complaint  Chief Complaint  Patient presents with  . Pain    pt here for 1 month follow up for back pain    HPI   Back pain.  Patient seen for 1 month follow-up of back pain. 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 Since that time he's been on a regimen of cyclobenzaprine 5 mg a meloxicam.     Past Medical History  Diagnosis Date  . Depression     Social History  Substance Use Topics  . Smoking status: Never Smoker   . Smokeless tobacco: Not on file  . Alcohol Use: 0.0 oz/week    0 Standard drinks or equivalent per week     Comment: rarely     Current outpatient prescriptions:  .  cyclobenzaprine (FLEXERIL) 5 MG tablet, Take 1 tablet (5 mg total) by mouth 3 (three) times daily as needed for muscle spasms., Disp: 30 tablet, Rfl: 1 .  meloxicam (MOBIC) 15 MG tablet, Take 1 tablet (15 mg total) by mouth daily., Disp: 30 tablet, Rfl: 0 .  saw palmetto 80 MG capsule, Take 80 mg by mouth 2 (two) times daily., Disp: , Rfl:   No Known Allergies  Review of Systems  Musculoskeletal: Positive for back pain and neck pain.  Psychiatric/Behavioral: Substance abuse: thoracic pain.     Objective  Filed Vitals:   04/27/15 1435  BP: 122/80  Pulse: 68  Temp: 97.5 F (36.4 C)  Resp: 16  Height: 6' (1.829 m)  Weight: 205 lb 2 oz (93.044 kg)  SpO2: 98%     Physical Exam  HENT:  Head: Normocephalic.  Neck:  Mild trapezius and sternomastoid muscles present. Very limited limitation of lateral rotation of the C-spine  And in flexion and extension  Musculoskeletal: He exhibits tenderness.      Assessment & Plan    1. Bilateral thoracic back pain Essentially  resolved - gabapentin (NEURONTIN) 100 MG capsule; Take 1 capsule (100 mg  total) by mouth 3 (three) times daily.  Dispense: 90 capsule; Refill: 3  2. Cervical pain (neck) Persistent and possibly slightly worse with his work - gabapentin (NEURONTIN) 100 MG capsule; Take 1 capsule (100 mg total) by mouth 3 (three) times daily.  Dispense: 90 capsule; Refill: 3  3. Lumbar muscle pain Resolved essentially - gabapentin (NEURONTIN) 100 MG capsule; Take 1 capsule (100 mg total) by mouth 3 (three) times daily.  Dispense: 90 capsule; Refill: 3

## 2015-04-27 NOTE — Addendum Note (Signed)
Addended by: Lelon Frohlich D on: 04/27/2015 03:31 PM   Modules accepted: Orders

## 2015-04-27 NOTE — Therapy (Addendum)
[place] MAIN [facility] SERVICES [address] [place], [place], [address] Phone: [phone]   Fax:  [phone]  Physical Therapy Treatment/Progress Note From 04/01/15 to 04/27/15  Patient Details  Name: David Foster MRN: [id number] Date of Birth: 1985-10-02 Referring Provider:  Ashok Norris, MD  Encounter Date: 04/27/2015      PT End of Session - 04/27/15 1130    Visit Number 6   Number of Visits 17   Date for PT Re-Evaluation 05/25/15   PT Start Time 1125   PT Stop Time 1205   PT Time Calculation (min) 40 min   Activity Tolerance Patient tolerated treatment well;Patient limited by fatigue   Behavior During Therapy [facility] for tasks assessed/performed      Past Medical History  Diagnosis Date  . Depression     Past Surgical History  Procedure Laterality Date  . Knee surgery Left 2013    There were no vitals filed for this visit.  Visit Diagnosis:  Neck pain on right side  Upper back pain on right side  Shoulder weakness      Subjective Assessment - 04/27/15 1129    Subjective Patient reports doing well this AM. He denies any pain currently, but reports that he has not been working yet today. He does still feel like there is a little "inflammation" in the upper back.    Pertinent History History of low back pain. Family history of back problems in father.    Limitations Lifting;Walking   How long can you sit comfortably? As long as needed   How long can you stand comfortably? as long as needed, provided no heavy lifting.    How long can you walk comfortably? as long as needed, provided no heavy lifting.    Diagnostic tests X-ray. Mild degenerative changes, no other abnormalities noted.    Patient Stated Goals reduce pain, increase function at work.    Currently in Pain? No/denies   Pain Onset More than a month ago            [facility] PT Assessment - 04/27/15 0001    Observation/Other Assessments   Modified [place]   10% (minimal disability) improved as compared to initial eval on 04/01/15 which was 18% impaired    Strength   Overall Strength Comments BUE gross strength is 5/5 with excpetion of RUE middle and lower trap strength of 4/5, with no pain reported during MMT          TREATMENT: Warm up on UBE, BUE backwards level 2 x3 min (Unbilled);   HOIST mid row, plate #4, 7D53; HOIST lat pull down plate #4,  2D92;  Patient required mod VCs during weight machine exercise to increase scapular retraction and to avoid painful ROM. He required min Vcs to slow down eccentric return for better muscle control. PT assessed patient's lifting ability by having patient push 93# on leg press with BUE. Patient able to perform 1 rep without pain, but exhibits increased weakness with muscle fatigue and decreased eccentric control.   PT assessed patient's progress towards goals, please see above objective measures.  Patient able to demonstrate BUE AROM WFL. He does demonstrate decreased LUE shoulder abduction with less scapular control as compared to RUE; PT performed myofascial release to patient's scapular paraspinals in sidelying x4 min each UE. Patient initially exhibited increased tightness on RUE but then was able to demonstrate better scapular mobility following manual therapy techniques. He denies any pain during manual therapy but did  report feeling more tightness in the RUE.  Patient exhibits decreased scapular control during BUE movement, especially with overhead reach and would benefit from scapular stabilization exercise.                         PT Education - 04/27/15 1218    Education provided Yes   Education Details exercise, plan of care   Person(s) Educated Patient   Methods Explanation;Verbal cues   Comprehension Returned demonstration;Verbal cues required;Verbalized understanding             PT Long Term Goals - 04/29/15 1707    PT LONG TERM GOAL #1   Title Patient  will be independent with HEP to improve strength and decrease pain at work by 05/25/15   Time 4   Period Weeks   Status On-going   PT LONG TERM GOAL #2   Title Improve Modified ODI by > 6 points to indicate decreased effect of back pain on daily life as well as improved function by 05/25/15   Baseline 18   Time 4   Period Weeks   Status Achieved   PT LONG TERM GOAL #3   Title Patient will increase all upper extremity strengthening to 5/5 to allow greater function at work and decreased pain by 05/25/15   Time 4   Period Weeks   Status Partially Met   PT LONG TERM GOAL #4   Title Patient will be able to move box up to 100 lbs without increase in pain to allow improved function at work by 05/25/15   Time 4   Period Weeks   Status Partially Met   PT LONG TERM GOAL #5   Title Patient will demonstrate full equal bilateral thoracic and cervical ROM to allow improved function to move heavy boxes at work by 05/25/15   Time 4   Period Weeks   Status Achieved                Plan - 04/27/15 1219    Clinical Impression Statement Patient was instructed in BUE postural strengthening exercise. PT assessed patient's progress towards goals. Please see attached objective measures. Patient was able to push 100 pounds with BUE, however did exhibit increased fatigue. He also exhibits continued weakness in right middle/lower trap as compared to LUE. Patient reports feeling "inflammation" in left scapular paraspinals. PT identified some tenderness along scapular paraspinals. He does demonstrate good scpular mobility as evidenced when assessing myofascial tightness. Patient would benefit from additional skilled PT intervention to improve scapular stability and reduce pain with work ADLs.    Pt will benefit from skilled therapeutic intervention in order to improve on the following deficits Hypomobility;Decreased activity tolerance;Decreased range of motion;Decreased strength;Postural dysfunction;Improper body  mechanics   Rehab Potential Excellent   Clinical Impairments Affecting Rehab Potential Positive: young in age, High prior level of function and recent onset. Negative, poor posture, recurrent back problems   PT Frequency 2x / week   PT Duration 4 weeks   PT Treatment/Interventions Cryotherapy;Moist Heat;Traction;Ultrasound;Electrical Stimulation;Stair training;Functional mobility training;Therapeutic exercise;Patient/family education;Manual techniques;Passive range of motion;Energy conservation;Therapeutic activities;Dry needling;Taping   PT Next Visit Plan cervical ROM postural exercises, manual therapy    PT Home Exercise Plan continue as previously given   Consulted and Agree with Plan of Care Patient        Problem List Patient Active Problem List   Diagnosis Date Noted  . Annual physical exam 03/25/2015  . Cervical pain (neck) 03/25/2015  .  Lumbar muscle pain 03/25/2015  . Bilateral thoracic back pain 03/25/2015    Hopkins,Marsena Taff, PT, DPT 04/27/2015, 12:22 PM  [place] MAIN [facility] SERVICES [address] [place], [place], [address] Phone: [phone]   Fax:  [phone]

## 2015-04-29 ENCOUNTER — Encounter: Payer: Self-pay | Admitting: Physical Therapy

## 2015-04-29 ENCOUNTER — Ambulatory Visit: Payer: BLUE CROSS/BLUE SHIELD | Admitting: Physical Therapy

## 2015-04-29 DIAGNOSIS — M542 Cervicalgia: Secondary | ICD-10-CM | POA: Diagnosis not present

## 2015-04-29 DIAGNOSIS — M549 Dorsalgia, unspecified: Secondary | ICD-10-CM

## 2015-04-29 DIAGNOSIS — R29898 Other symptoms and signs involving the musculoskeletal system: Secondary | ICD-10-CM

## 2015-04-29 NOTE — Therapy (Signed)
[place] MAIN [facility] SERVICES [address] [place], [place], [address] Phone: [phone]   Fax:  [phone]  Physical Therapy Treatment  Patient Details  Name: David Foster MRN: [id number] Date of Birth: 1986/06/04 Referring Provider:  Ashok Norris, MD  Encounter Date: 04/29/2015      PT End of Session - 04/29/15 1711    Visit Number 7   Number of Visits 17   Date for PT Re-Evaluation 05/25/15   PT Start Time 1128   PT Stop Time 1214   PT Time Calculation (min) 46 min   Activity Tolerance Patient tolerated treatment well;Patient limited by pain   Behavior During Therapy [facility] for tasks assessed/performed      Past Medical History  Diagnosis Date  . Depression     Past Surgical History  Procedure Laterality Date  . Knee surgery Left 2013    There were no vitals filed for this visit.  Visit Diagnosis:  Neck pain on right side  Upper back pain on right side  Shoulder weakness      Subjective Assessment - 04/29/15 1131    Subjective Patient reports increased "back spasm" today after trying star gazer stretch earlier. He reports that he was stretching and felt all his muscles "tense up" and has been feeling pain since. Patient reports that his spine is fine, its just all in the muscles.    Pertinent History History of low back pain. Family history of back problems in father.    Limitations Lifting;Walking   How long can you sit comfortably? As long as needed   How long can you stand comfortably? as long as needed, provided no heavy lifting.    How long can you walk comfortably? as long as needed, provided no heavy lifting.    Diagnostic tests X-ray. Mild degenerative changes, no other abnormalities noted.    Patient Stated Goals reduce pain, increase function at work.    Currently in Pain? Yes   Pain Score 5    Pain Location Back   Pain Orientation Upper   Pain Descriptors / Indicators Aching;Sharp   Pain Type  Chronic pain   Pain Radiating Towards neck   Pain Onset More than a month ago   Pain Frequency Intermittent   Aggravating Factors  lifting/work tasks   Pain Relieving Factors rest   Effect of Pain on Daily Activities reduced activity tolerance          TREATMENT: Warm up on UBE, BUE backwards level 2 x3 min (Unbilled); Patient reports increased thoracic back pain; PT identified decreased LUE shoulder flexion/abduction in sitting due to increased pain;  Prone: BUE shoulder extension 2# 2x10; BUE shoulder horizontal abduction 2# 2x10; BUE alternate UE flexion 2# 2x10 LUE shoulder row 2# 2x15 LUE shoulder high row 2# 2x15;  Patient required min-moderate verbal/tactile cues for correct exercise technique. He exhibits increased difficulty with LUE movement especially against gravity. Patient required min VCs to slow down UE movement, especially eccentric return for better postural strengthening.   PT performed manual therapy to reduce tightness in LUE shoulder: grade III inferior glide to glenohumeral joint 10 sec bouts x5 reps x2 sets; PROM of LUE shoulder into flexion/abduction with end range stretch x2-3 reps each; Patient able to demonstrate better shoulder AROM in sitting after manual therapy but reports continued pain along inferior scapula;   PT applied TENs, Interferential setting at tolerated intensity to patient's thoracic parasipnals, x15 min in prone concurrent with cryotherapy to reduce  discomfort . Patient responded well with less pain reported after treatment session and no redness or skin irritation noted. Pain after treatment session reported at 3/10 in upper back;                          PT Education - 04/29/15 1710    Education provided Yes   Education Details exercise, positioning, posture   Person(s) Educated Patient   Methods Explanation;Verbal cues   Comprehension Verbalized understanding;Returned demonstration;Verbal cues required              PT Long Term Goals - 04/29/15 1707    PT LONG TERM GOAL #1   Title Patient will be independent with HEP to improve strength and decrease pain at work by 05/25/15   Time 4   Period Weeks   Status On-going   PT LONG TERM GOAL #2   Title Improve Modified ODI by > 6 points to indicate decreased effect of back pain on daily life as well as improved function by 05/25/15   Baseline 18   Time 4   Period Weeks   Status Achieved   PT LONG TERM GOAL #3   Title Patient will increase all upper extremity strengthening to 5/5 to allow greater function at work and decreased pain by 05/25/15   Time 4   Period Weeks   Status Partially Met   PT LONG TERM GOAL #4   Title Patient will be able to move box up to 100 lbs without increase in pain to allow improved function at work by 05/25/15   Time 4   Period Weeks   Status Partially Met   PT LONG TERM GOAL #5   Title Patient will demonstrate full equal bilateral thoracic and cervical ROM to allow improved function to move heavy boxes at work by 05/25/15   Time 4   Period Weeks   Status Achieved               Plan - 04/29/15 1711    Clinical Impression Statement Patient reports increased upper back pain today. He exhibits minimal tenderness and tightness to scapular paraspinals but has pain with movement. PT instructed patient in postural strengthening. Patient did exhibit decreased LUE shoulder ROM against gravity. PT performed manual therapy/PROM to increase joint flexibility which helped reduce difficulty. Patient would benefit from additional skilled PT Intervention to reduce back pain and return to PLOF.   Pt will benefit from skilled therapeutic intervention in order to improve on the following deficits Hypomobility;Decreased activity tolerance;Decreased range of motion;Decreased strength;Postural dysfunction;Improper body mechanics   Rehab Potential Excellent   Clinical Impairments Affecting Rehab Potential Positive: young in  age, High prior level of function and recent onset. Negative, poor posture, recurrent back problems   PT Frequency 2x / week   PT Duration 4 weeks   PT Treatment/Interventions Cryotherapy;Moist Heat;Traction;Ultrasound;Electrical Stimulation;Stair training;Functional mobility training;Therapeutic exercise;Patient/family education;Manual techniques;Passive range of motion;Energy conservation;Therapeutic activities;Dry needling;Taping   PT Next Visit Plan cervical ROM postural exercises, manual therapy    PT Home Exercise Plan continue as previously given   Consulted and Agree with Plan of Care Patient        Problem List Patient Active Problem List   Diagnosis Date Noted  . Annual physical exam 03/25/2015  . Cervical pain (neck) 03/25/2015  . Lumbar muscle pain 03/25/2015  . Bilateral thoracic back pain 03/25/2015    Hopkins,Leontae Bostock, PT, DPT 04/29/2015, 5:13 PM  [place], [place], [address] Phone: [phone]   Fax:  [phone]

## 2015-04-29 NOTE — Addendum Note (Signed)
Addended by: Tiney Rouge E on: 04/29/2015 05:10 PM   Modules accepted: Orders

## 2015-05-04 ENCOUNTER — Encounter: Payer: Self-pay | Admitting: Physical Therapy

## 2015-05-04 ENCOUNTER — Ambulatory Visit: Payer: BLUE CROSS/BLUE SHIELD | Admitting: Physical Therapy

## 2015-05-04 DIAGNOSIS — M542 Cervicalgia: Secondary | ICD-10-CM | POA: Diagnosis not present

## 2015-05-04 DIAGNOSIS — R29898 Other symptoms and signs involving the musculoskeletal system: Secondary | ICD-10-CM

## 2015-05-04 DIAGNOSIS — M549 Dorsalgia, unspecified: Secondary | ICD-10-CM

## 2015-05-04 NOTE — Therapy (Signed)
[place] MAIN [facility] SERVICES [address] [place], [place], [address] Phone: [phone]   Fax:  [phone]  Physical Therapy Treatment  Patient Details  Name: David Foster MRN: [id number] Date of Birth: [birth year] Referring Provider:  Ashok Norris, MD  Encounter Date: 05/04/2015      PT End of Session - 05/04/15 1247    Visit Number 8   Number of Visits 17   Date for PT Re-Evaluation 05/25/15   PT Start Time 1130   PT Stop Time 1214   PT Time Calculation (min) 44 min   Activity Tolerance Patient tolerated treatment well;Patient limited by pain   Behavior During Therapy [facility] for tasks assessed/performed      Past Medical History  Diagnosis Date  . Depression     Past Surgical History  Procedure Laterality Date  . Knee surgery Left 2013    There were no vitals filed for this visit.  Visit Diagnosis:  Upper back pain on right side  Shoulder weakness  Neck pain on right side      Subjective Assessment - 05/04/15 1122    Subjective Patient reports that he is doing well upon arrival to PT treatment. He states that he has no pain, but notes a little burning in the upper back on the L side along the boarder or Scapula. He states that he did not experience any increase in pain in his back over the weekend.    Pertinent History History of low back pain. Family history of back problems in father.    Limitations Lifting;Walking   How long can you sit comfortably? As long as needed   How long can you stand comfortably? as long as needed, provided no heavy lifting.    How long can you walk comfortably? as long as needed, provided no heavy lifting.    Diagnostic tests X-ray. Mild degenerative changes, no other abnormalities noted.    Patient Stated Goals reduce pain, increase function at work.    Currently in Pain? Yes   Pain Score 1    Pain Location Back   Pain Orientation Upper;Left   Pain Descriptors / Indicators Burning    Pain Type Chronic pain   Pain Onset More than a month ago   Pain Frequency Intermittent   Aggravating Factors  lifting working.    Pain Relieving Factors rest   Effect of Pain on Daily Activities reduced activity             TREATMENT: Warm up on UBE, BUE backwards level 4 x3 min (Unbilled);  Supine.  ABC Stability exercise with 4# ball, x1 each UE Shoulder protraction, 4#ball 2x10each UE Standing ABC stability exercise with 4# ball against wall x1 each UE Moderate verbal instruction for proper speed of movement and to decrease scapular winging with exercise in stance. Good response noted from instruction.  Patient demonstrates increased difficulty with full range Scapular protraction on the L UE compared to the R.      Prone: BUE shoulder extension 3# 2x10;  BUE shoulder horizontal abduction 3# 2x10; BUE alternate UE flexion 3# 2x10 BUE shoulder row 3# 2x10 BUE shoulder high row 3# 2x10;  Patient required Min verbal and tactile instruction for proper exercise technique, including proper exercise position, increased scapular retraction, and equal bilateral movements of both shoulder complexes. Patient demonstrates moderate response to verbal instruction with improve ROM and muscle activation to generate scapular protraction.    Grade III thoracic/scapular mobilizations into protraction and  upward rotation. 3 bouts x 30 seconds each UE complex.  Patient able to demonstrate better shoulder AROM and quality of movement in sitting after manual therapy but reports continued pain along inferior scapula;   PT applied TENs, Interferential setting at tolerated intensity to patient's thoracic parasipnals, x15 min in prone concurrent with cryotherapy to reduce discomfort . Patient tolerate TENs/cyrotherapy well and states that he has no pain/discomfort in upper/mid back upon completion of PT treatment.                      PT Education - 05/04/15 1246    Education  provided Yes   Education Details exercise manual therapy    Person(s) Educated Patient   Methods Explanation;Demonstration;Tactile cues;Verbal cues   Comprehension Verbalized understanding;Returned demonstration;Verbal cues required;Tactile cues required             PT Long Term Goals - 04/29/15 1707    PT LONG TERM GOAL #1   Title Patient will be independent with HEP to improve strength and decrease pain at work by 05/25/15   Time 4   Period Weeks   Status On-going   PT LONG TERM GOAL #2   Title Improve Modified ODI by > 6 points to indicate decreased effect of back pain on daily life as well as improved function by 05/25/15   Baseline 18   Time 4   Period Weeks   Status Achieved   PT LONG TERM GOAL #3   Title Patient will increase all upper extremity strengthening to 5/5 to allow greater function at work and decreased pain by 05/25/15   Time 4   Period Weeks   Status Partially Met   PT LONG TERM GOAL #4   Title Patient will be able to move box up to 100 lbs without increase in pain to allow improved function at work by 05/25/15   Time 4   Period Weeks   Status Partially Met   PT LONG TERM GOAL #5   Title Patient will demonstrate full equal bilateral thoracic and cervical ROM to allow improved function to move heavy boxes at work by 05/25/15   Time 4   Period Weeks   Status Achieved               Plan - 05/04/15 1248    Clinical Impression Statement Patient reports only mild pain in the upper back upon arrival to PT. Patient instructed in Bilateral upper back strengthening/ shoulder stabilization exercises. Patient required min-mod verbal and tactile instruction for proper exercise technique for increase scapular retraction and increased ROM. Manual therapy provided to reduce myofascial restrictions of bilateral scapula. Patient reports slight increase in pain following exercises/manual therapy, but noted to have improved quality of movement with L shoulder flexion and  abduction. TENs and cryotherapy applied following manual therapy and exercises; no pain or discomfort noted following TENs. Continued skilled PT is recommended to reduce back pain and to return to PLOF.   Pt will benefit from skilled therapeutic intervention in order to improve on the following deficits Hypomobility;Decreased activity tolerance;Decreased range of motion;Decreased strength;Postural dysfunction;Improper body mechanics   Rehab Potential Excellent   Clinical Impairments Affecting Rehab Potential Positive: young in age, High prior level of function and recent onset. Negative, poor posture, recurrent back problems   PT Frequency 2x / week   PT Duration 4 weeks   PT Treatment/Interventions Cryotherapy;Moist Heat;Traction;Ultrasound;Electrical Stimulation;Stair training;Functional mobility training;Therapeutic exercise;Patient/family education;Manual techniques;Passive range of motion;Energy conservation;Therapeutic activities;Dry needling;Taping  PT Next Visit Plan cervical ROM postural exercises, manual therapy    PT Home Exercise Plan continue as previously given   Consulted and Agree with Plan of Care Patient        Problem List Patient Active Problem List   Diagnosis Date Noted  . Annual physical exam 03/25/2015  . Cervical pain (neck) 03/25/2015  . Lumbar muscle pain 03/25/2015  . Bilateral thoracic back pain 03/25/2015   Barrie Folk SPT 05/04/2015   2:38 PM  This entire session was performed under direct supervision and direction of a licensed therapist. I have personally read, edited and approve of the note as written.   Hopkins,Margaret, PT, DPT 05/04/2015, 2:38 PM  [place] MAIN [facility] SERVICES [address] [place], [place], [address] Phone: [phone]   Fax:  [phone]

## 2015-05-06 ENCOUNTER — Ambulatory Visit: Payer: BLUE CROSS/BLUE SHIELD | Admitting: Physical Therapy

## 2015-05-06 ENCOUNTER — Encounter: Payer: Self-pay | Admitting: Physical Therapy

## 2015-05-06 DIAGNOSIS — R29898 Other symptoms and signs involving the musculoskeletal system: Secondary | ICD-10-CM

## 2015-05-06 DIAGNOSIS — M542 Cervicalgia: Secondary | ICD-10-CM | POA: Diagnosis not present

## 2015-05-06 DIAGNOSIS — M549 Dorsalgia, unspecified: Secondary | ICD-10-CM

## 2015-05-06 NOTE — Patient Instructions (Signed)
Scapular: Protraction - 90 of Flexion   Standing with green/blue band around shoulder blades, holding arms straight out in front of you, keeping elbow straight, try to punch arms forward rounding shoulder blades. Repeat 10 times, 2 sets, 2x a day; http://orth.exer.us/856   Copyright  VHI. All rights reserved.  Strengthening: Wall Push-Up   With arms slightly wider apart than shoulder width,  gently lean body toward wall. Repeat __10__ times per set. Do _2___ sets per session. Do __2__ sessions per day.  http://orth.exer.us/904   Copyright  VHI. All rights reserved.  Functional Pattern Strengthening: Backhand   Holding green/blue band in both hands, with elbows straight, bring arms apart in diagonal pattern as shown above, 10 times each direction x2 sets x2 per day;  http://orth.exer.us/850   Copyright  VHI. All rights reserved.

## 2015-05-06 NOTE — Therapy (Signed)
[place] MAIN [facility] SERVICES [address] [place], [place], [address] Phone: [phone]   Fax:  [phone]  Physical Therapy Treatment/Discharge Summary  Patient Details  Name: David Foster MRN: [id number] Date of Birth: 11/16/1985 Referring Provider:  Ashok Norris, MD  Encounter Date: 05/06/2015      PT End of Session - 05/06/15 1135    Visit Number 9   Number of Visits 17   Date for PT Re-Evaluation 05/25/15   PT Start Time 1018   PT Stop Time 1059   PT Time Calculation (min) 41 min   Activity Tolerance Patient tolerated treatment well;No increased pain   Behavior During Therapy [facility] for tasks assessed/performed      Past Medical History  Diagnosis Date  . Depression     Past Surgical History  Procedure Laterality Date  . Knee surgery Left 2013    There were no vitals filed for this visit.  Visit Diagnosis:  Upper back pain on right side  Shoulder weakness  Neck pain on right side      Subjective Assessment - 05/06/15 1134    Subjective Patient reports doing well. He reports compliance with HEP including using green tband. Patient reports less pain today overall. He states, "I think that the exercises help reduce my pain."   Pertinent History History of low back pain. Family history of back problems in father.    Limitations Lifting;Walking   How long can you sit comfortably? As long as needed   How long can you stand comfortably? as long as needed, provided no heavy lifting.    How long can you walk comfortably? as long as needed, provided no heavy lifting.    Diagnostic tests X-ray. Mild degenerative changes, no other abnormalities noted.    Patient Stated Goals reduce pain, increase function at work.    Currently in Pain? Yes   Pain Score 1    Pain Location Back   Pain Orientation Upper   Pain Descriptors / Indicators Burning   Pain Type Chronic pain   Pain Radiating Towards left shoulder blade   Pain  Onset More than a month ago   Pain Frequency Intermittent   Aggravating Factors  lifting/working   Pain Relieving Factors rest/exercise   Effect of Pain on Daily Activities reduced activity        TREATMENT: Warm up on UBE BUE, level 2 x3 min (Unbilled);  HOIST mid row, BUE plate #4, [id number] with min Vcs to increase scapular retraction. HOIST lat pull down, BUE plate #4, [id number] with min VCs to increase stretch during eccentric return  Standing facing wall: BUE shift forward into wall, low "V" to high "V" 2x10; BUE alternate UE lift into shoulder flexion 3# 2x10 bilaterally  Standing with back against ball against wall with 3# BUE: Alternate UE lift into flexion x10 bilaterally; BUE shoulder abduction x10 bilaterally;  Patient required min-moderate verbal/tactile cues for correct exercise technique including to increase ROM for better strengthening. Patient exhibits increased fatigue with 3# handweight. He was able to demonstrate better LUE A/ROM as compared to previous sessions with less scapular discomfort.  PT assessed patient's BUE gross strength: Grossly 5/5 with exception of LUE scapular paraspinals of 4+/5 including rhomboids and middle trapezius.  PT advanced HEP with instruction in blue tband exercise: BUE shoulder protraction in standing x10 reps; BUE shoulder diagonals with horizontal abduction x10 each direction; patient required min VCs to increase LUE shoulder A/ROM into flexion with horizontal abduction  during up diagonal to increase scapular paraspinal strength with better scapular retraction.                          PT Education - 05/06/15 1135    Education provided Yes   Education Details advanced HEP, exercise, posture   Person(s) Educated Patient   Methods Explanation;Verbal cues   Comprehension Verbalized understanding;Verbal cues required             PT Long Term Goals - 05/06/15 1137    PT LONG TERM GOAL #1   Title Patient will  be independent with HEP to improve strength and decrease pain at work by 05/25/15   Time 4   Period Weeks   Status Achieved   PT LONG TERM GOAL #2   Title Improve Modified ODI by > 6 points to indicate decreased effect of back pain on daily life as well as improved function by 05/25/15   Baseline 18   Time 4   Period Weeks   Status Achieved   PT LONG TERM GOAL #3   Title Patient will increase all upper extremity strengthening to 5/5 to allow greater function at work and decreased pain by 05/25/15   Time 4   Period Weeks   Status Partially Met   PT LONG TERM GOAL #4   Title Patient will be able to move box up to 100 lbs without increase in pain to allow improved function at work by 05/25/15   Time 4   Period Weeks   Status Partially Met   PT LONG TERM GOAL #5   Title Patient will demonstrate full equal bilateral thoracic and cervical ROM to allow improved function to move heavy boxes at work by 05/25/15   Time 4   Period Weeks   Status Achieved               Plan - 05/06/15 1136    Clinical Impression Statement Patient reports less pain today. He was instructed in postural strengthening including scapular stabilization exercise. Patient required min VCs for correct exercise technique including to increase left scapular retraction during shoulder flexion for better ROM. Patient responded well to cues. He continues to have a litte weakness in LUE scapular paraspinals. However, patient is independent in HEP and reports being able to manage pain independently. PT recommends discharge from PT at this time based on patient's progress. Patient is agreeable.    Pt will benefit from skilled therapeutic intervention in order to improve on the following deficits Hypomobility;Decreased activity tolerance;Decreased range of motion;Decreased strength;Postural dysfunction;Improper body mechanics   Rehab Potential Excellent   Clinical Impairments Affecting Rehab Potential Positive: young in age,  High prior level of function and recent onset. Negative, poor posture, recurrent back problems   PT Frequency 2x / week   PT Duration 4 weeks   PT Treatment/Interventions Cryotherapy;Moist Heat;Traction;Ultrasound;Electrical Stimulation;Stair training;Functional mobility training;Therapeutic exercise;Patient/family education;Manual techniques;Passive range of motion;Energy conservation;Therapeutic activities;Dry needling;Taping   PT Next Visit Plan Discharge from PT today   PT Home Exercise Plan advanced-see patient instructions   Consulted and Agree with Plan of Care Patient        Problem List Patient Active Problem List   Diagnosis Date Noted  . Annual physical exam 03/25/2015  . Cervical pain (neck) 03/25/2015  . Lumbar muscle pain 03/25/2015  . Bilateral thoracic back pain 03/25/2015    Hopkins,Alvina Strother, PT, DPT 05/06/2015, 11:38 AM  [place] [facility]ICES [place], [place], [address] Phone: [phone]   Fax:  [phone]

## 2015-05-13 ENCOUNTER — Encounter: Payer: BLUE CROSS/BLUE SHIELD | Admitting: Physical Therapy

## 2015-05-18 ENCOUNTER — Encounter: Payer: BLUE CROSS/BLUE SHIELD | Admitting: Physical Therapy

## 2015-05-20 ENCOUNTER — Encounter: Payer: BLUE CROSS/BLUE SHIELD | Admitting: Physical Therapy

## 2016-05-26 ENCOUNTER — Ambulatory Visit: Payer: BLUE CROSS/BLUE SHIELD | Admitting: Family Medicine

## 2016-10-27 IMAGING — CR DG LUMBAR SPINE COMPLETE 4+V
1 series · 5 of 5 positions shown · non-contrast
Comparison: 04/27/2009.

CLINICAL DATA: Low back pain for 10 years.

EXAM:
LUMBAR SPINE - COMPLETE 4+ VIEW

[Series 1: dg lumbar spine complete 4 +v · 0.14mm/px · 5 of 5 slices shown]
[im 1/5]
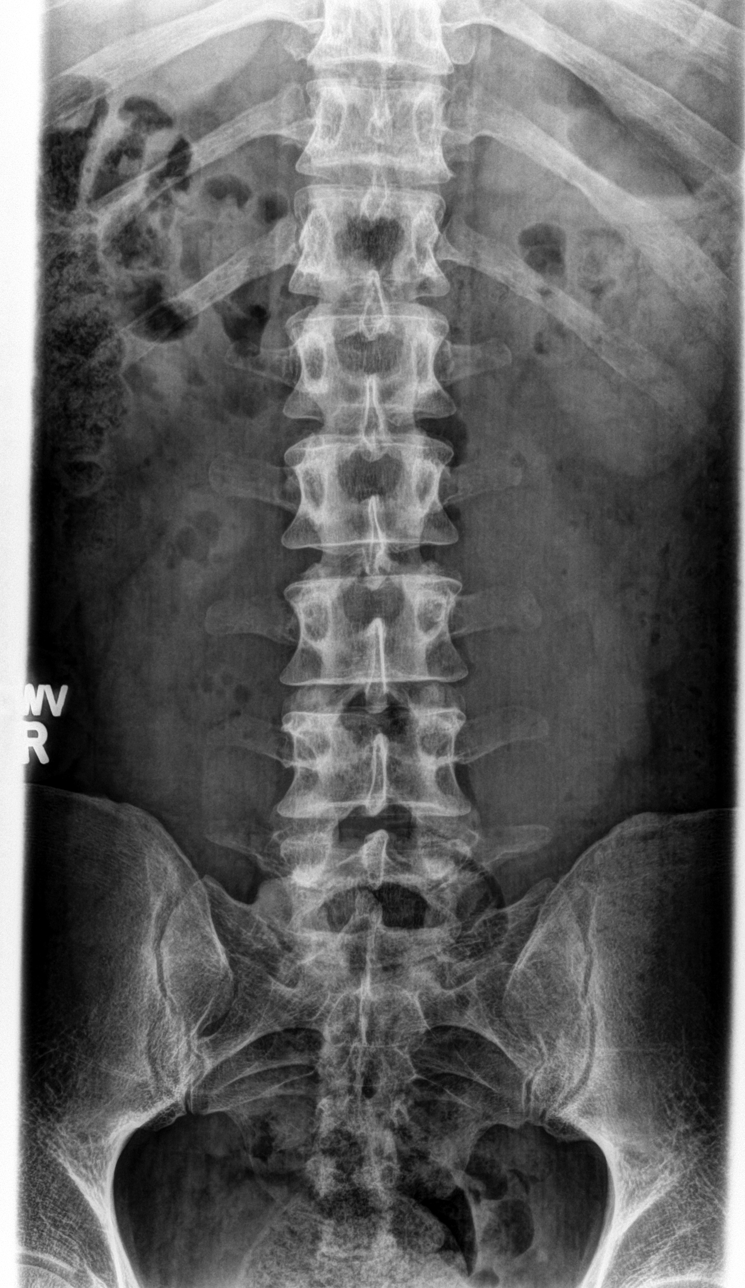
[im 2/5]
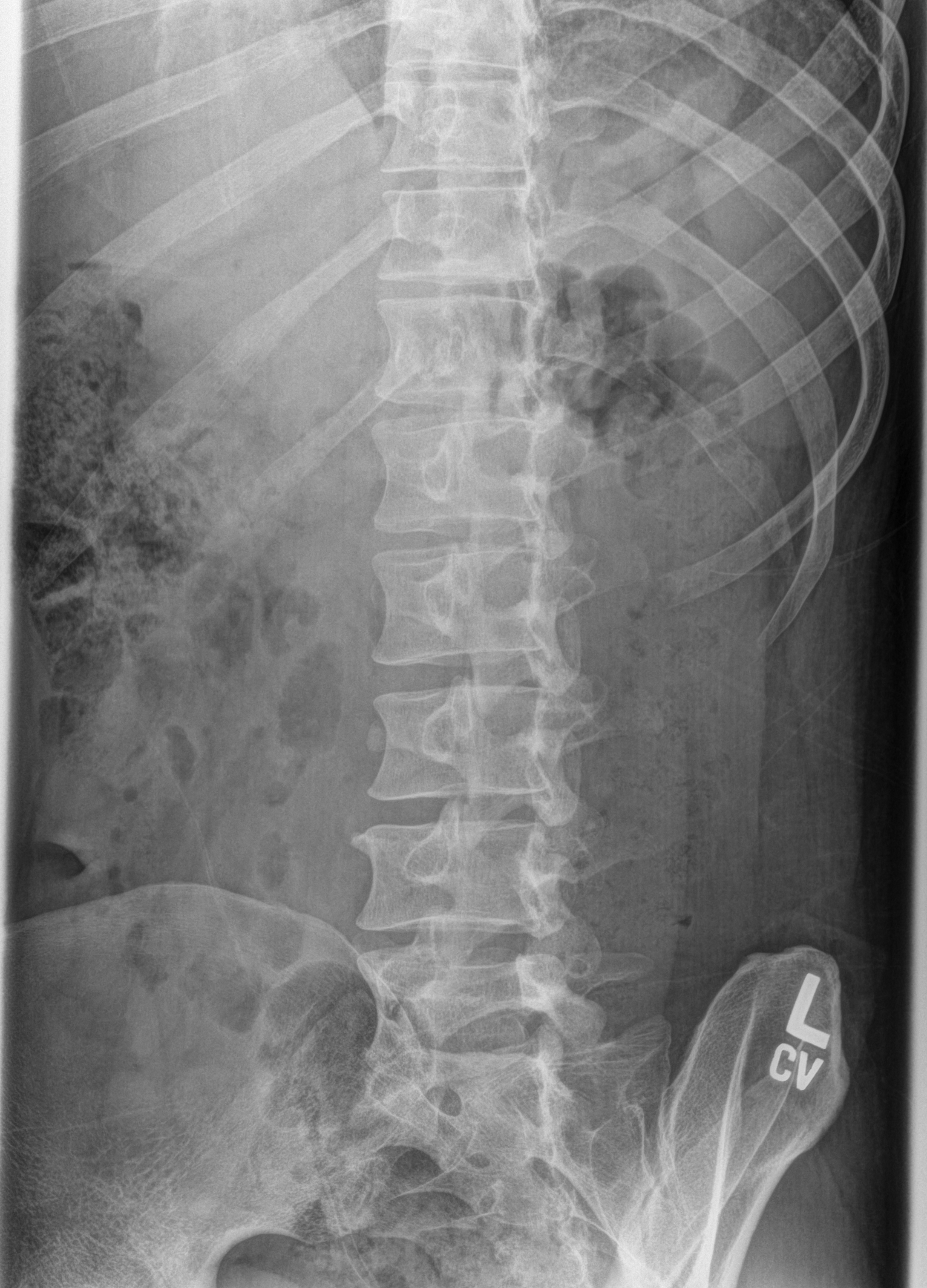
[im 3/5]
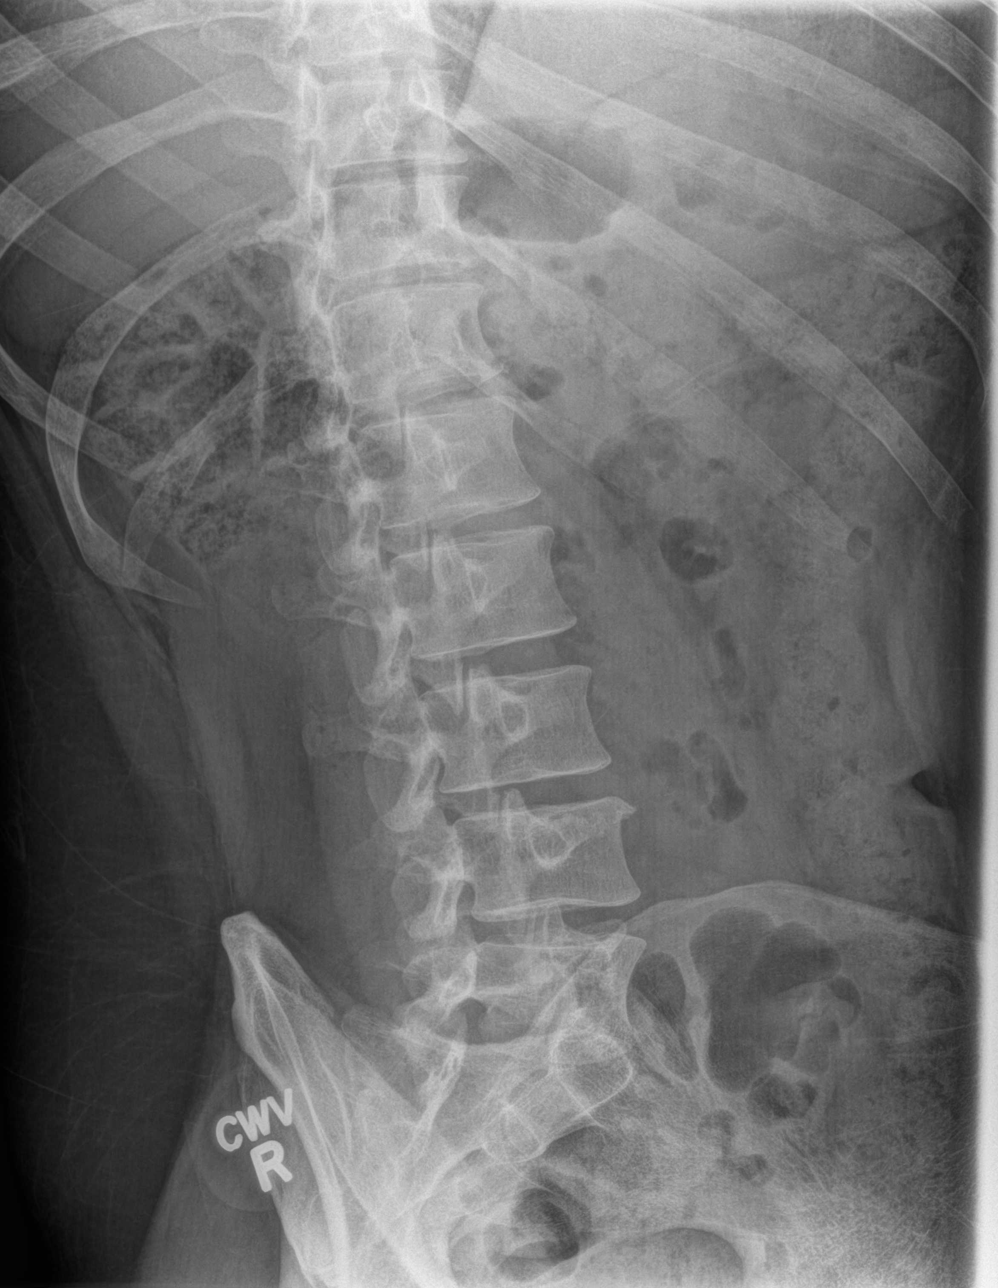
[im 4/5]
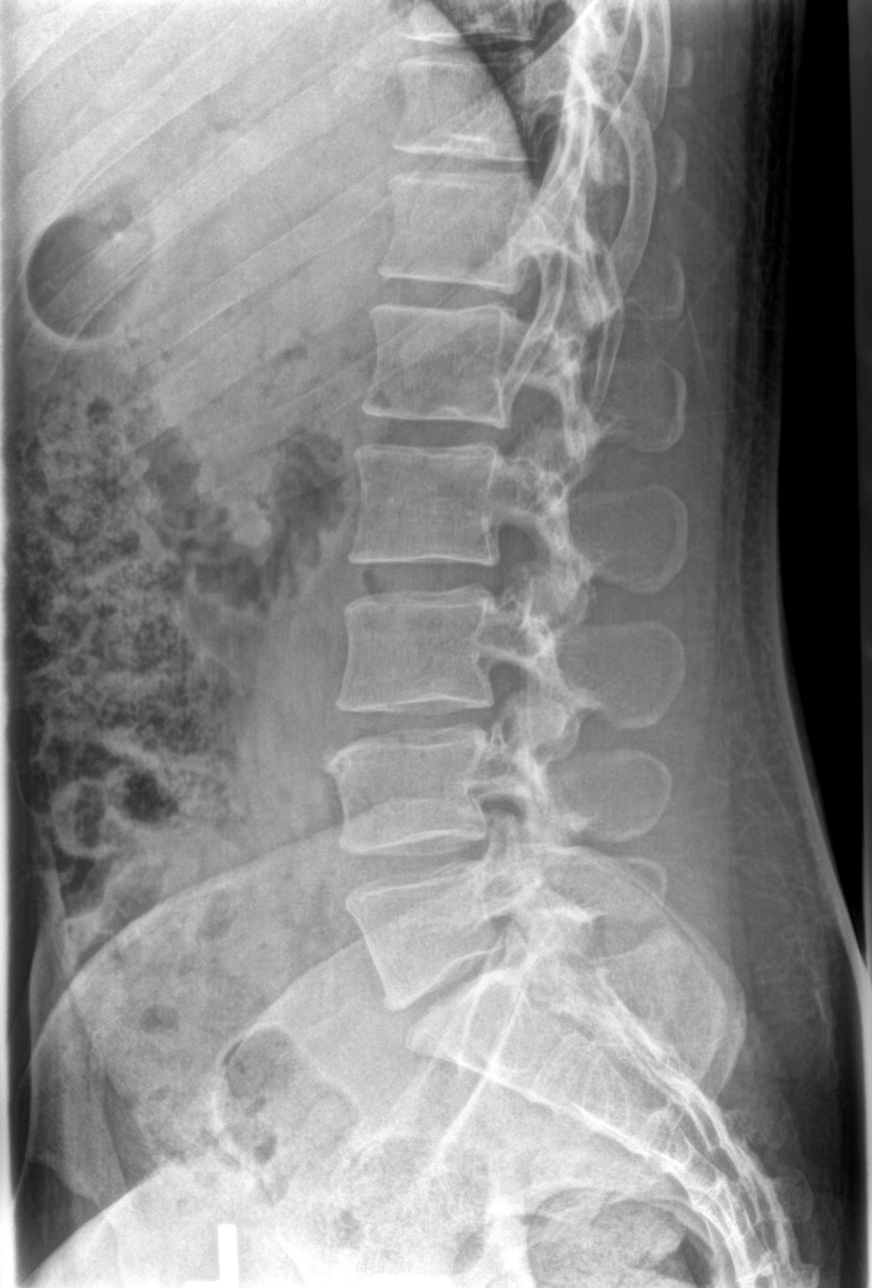
[im 5/5]
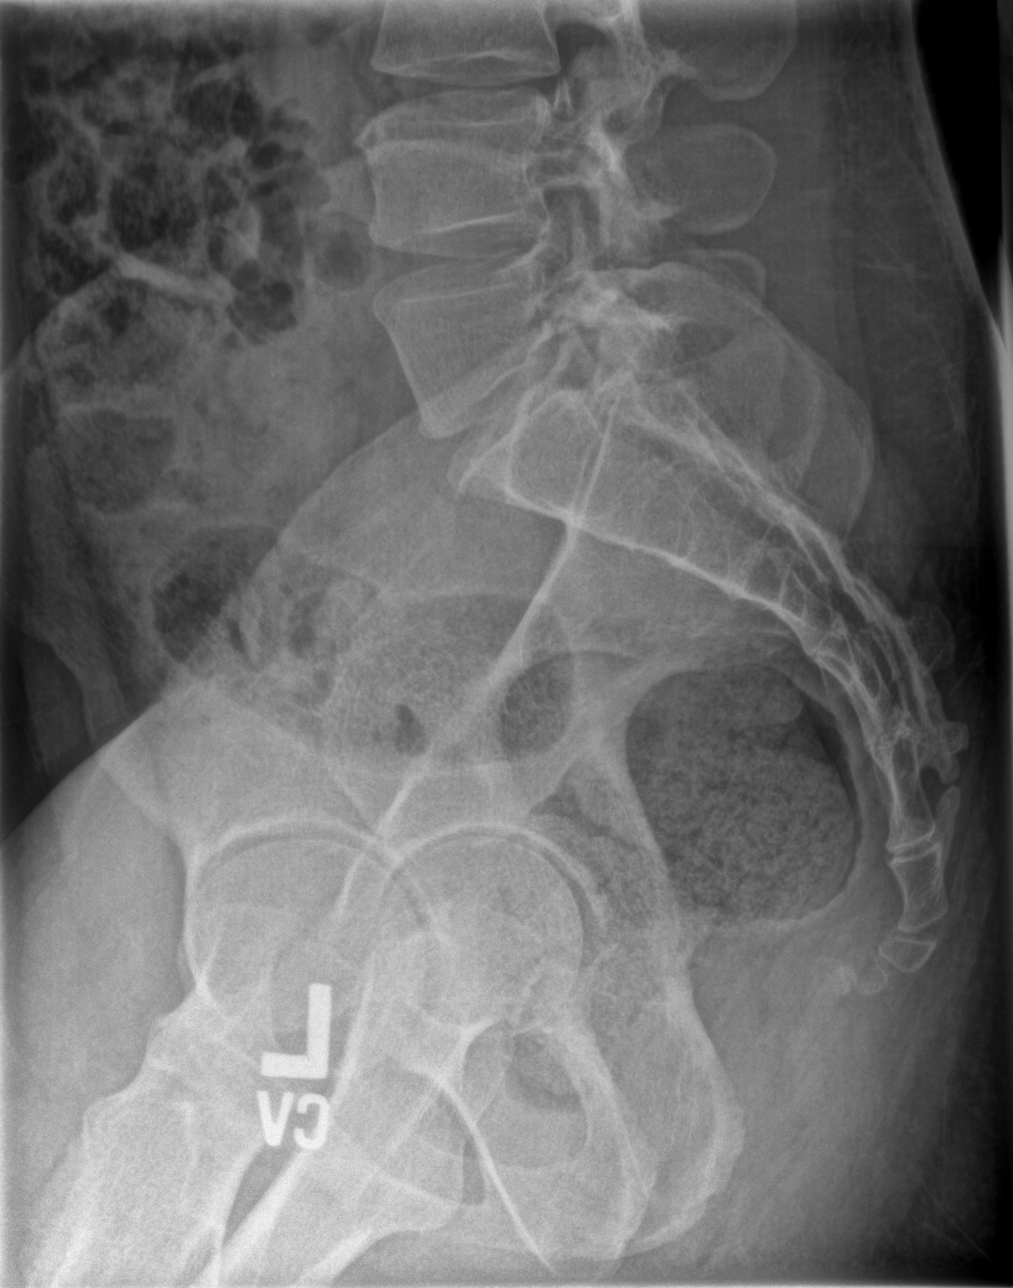

[5 of 5 positions shown; findings below may reference images not displayed]

FINDINGS: Paraspinal soft tissues normal. No acute bony abnormality. Diffuse
degenerative change. Normal alignment mineralization.
IMPRESSION: Diffuse degenerative change.  No acute abnormality.

## 2016-10-27 IMAGING — CR DG THORACIC SPINE 3V
1 series · 4 of 4 positions shown · non-contrast
Comparison: None.

CLINICAL DATA: Patient with C7-T1 pain. No known injury. Initial
encounter.

EXAM:
THORACIC SPINE - 2 VIEW + SWIMMERS

[Series 1: view not recorded · 0.14mm/px · 4 of 4 slices shown]
[im 1/4]
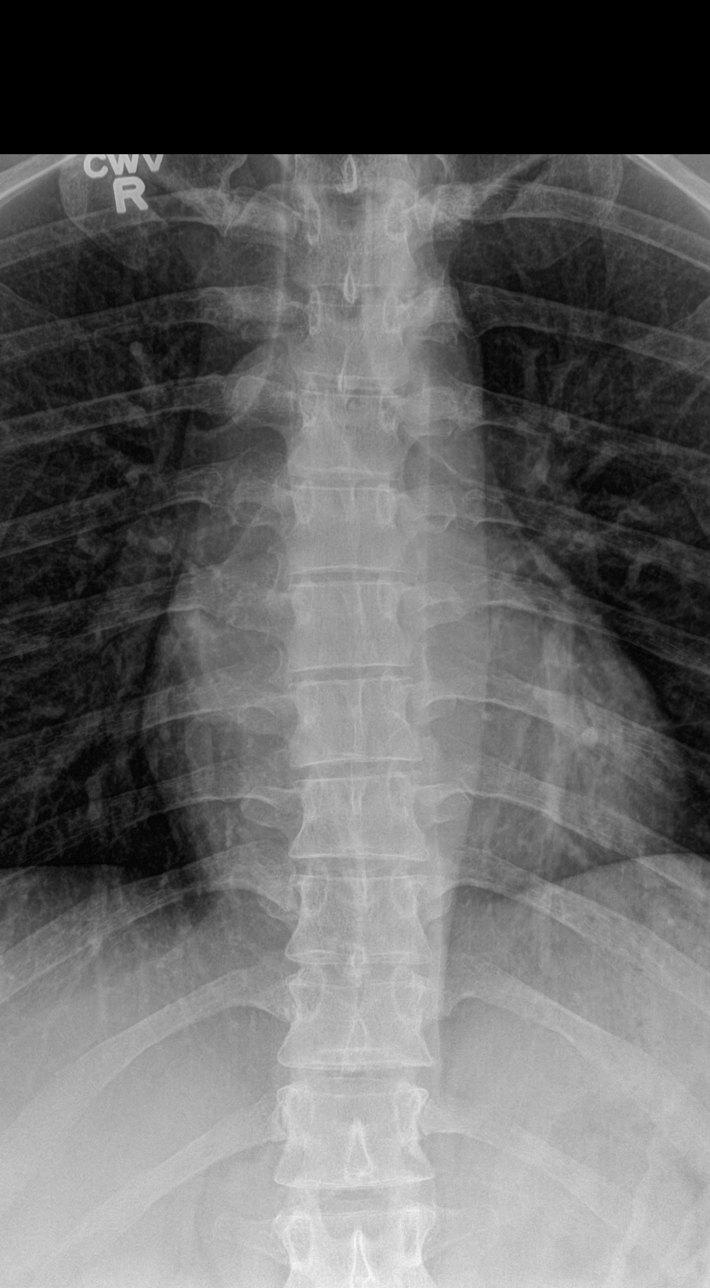
[im 2/4]
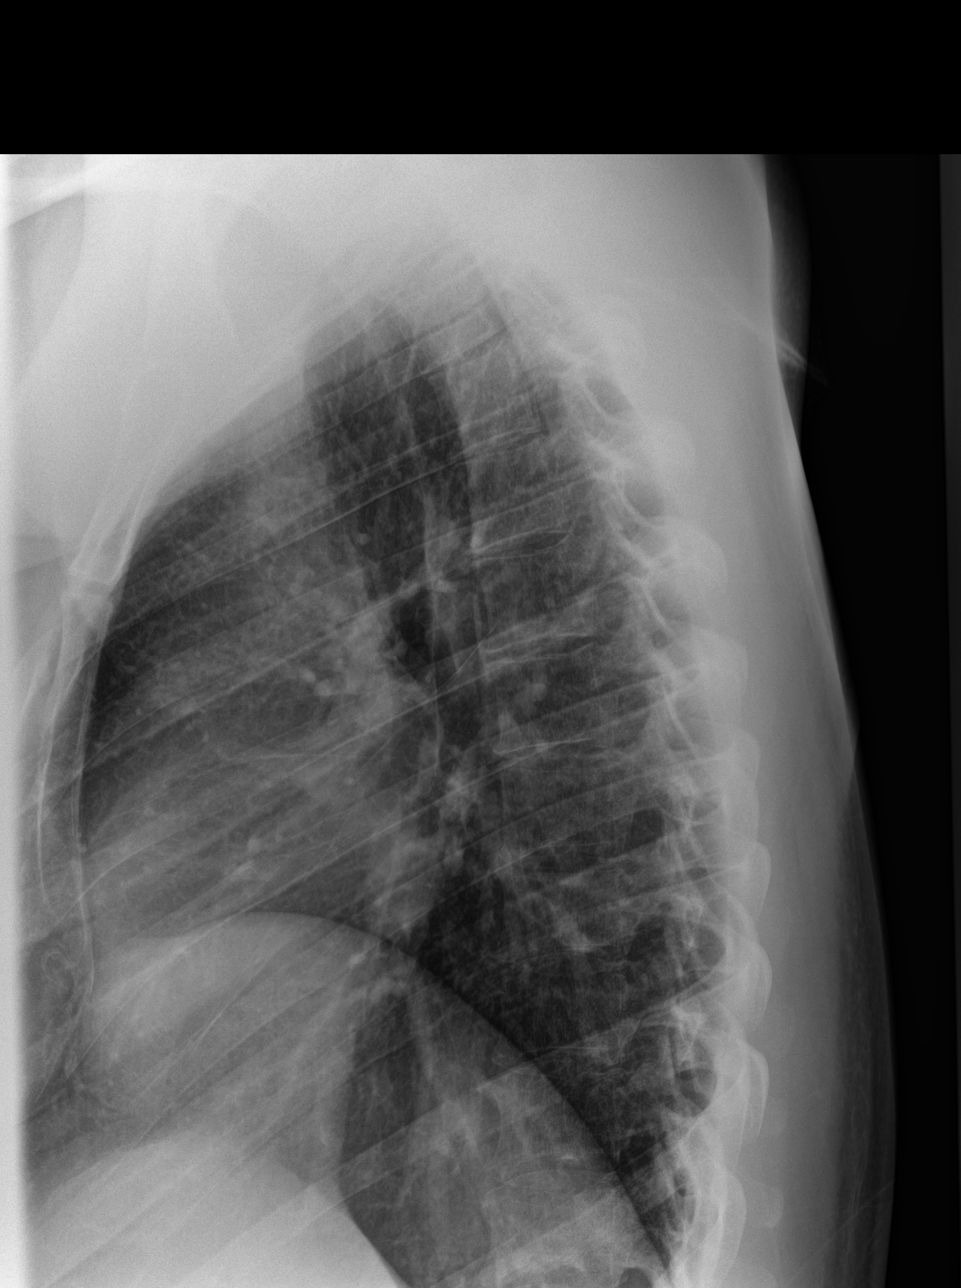
[im 3/4]
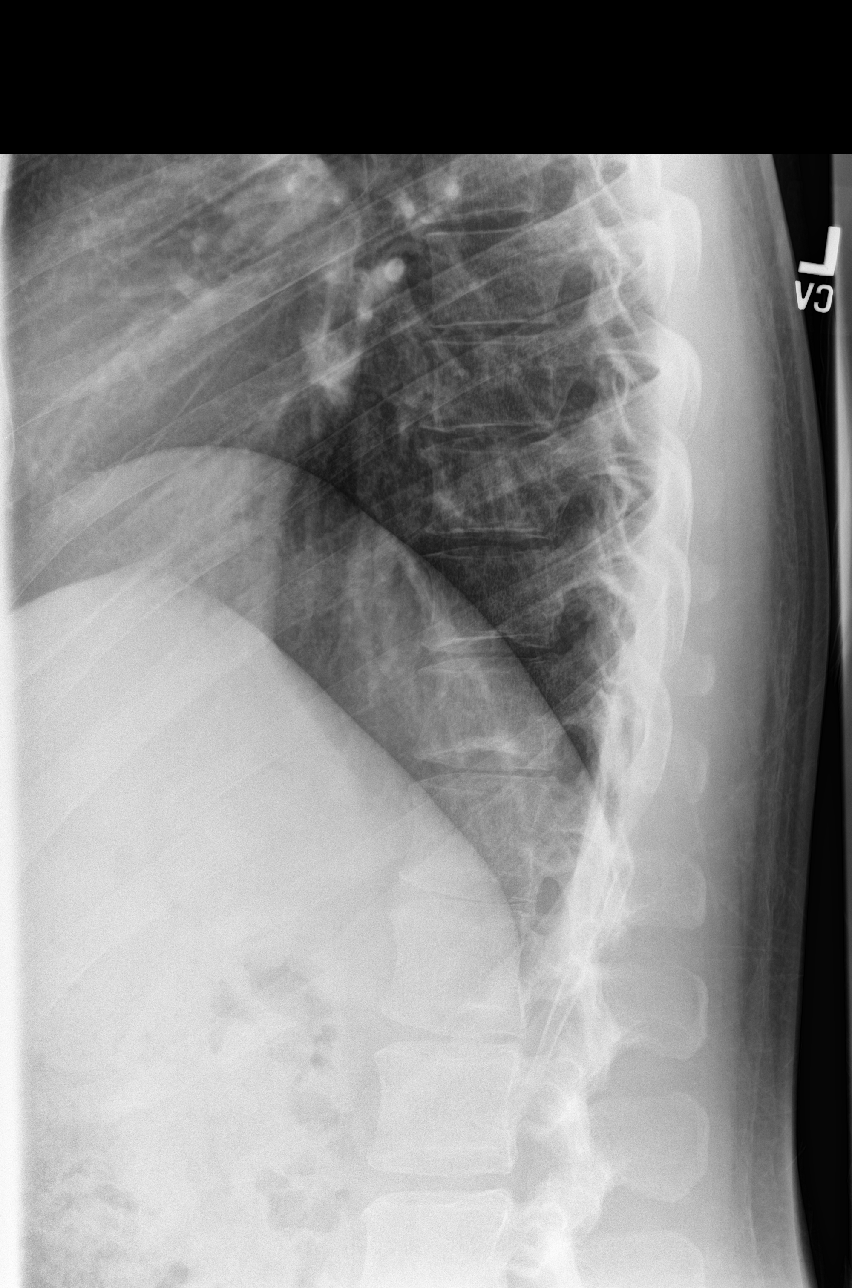
[im 4/4]
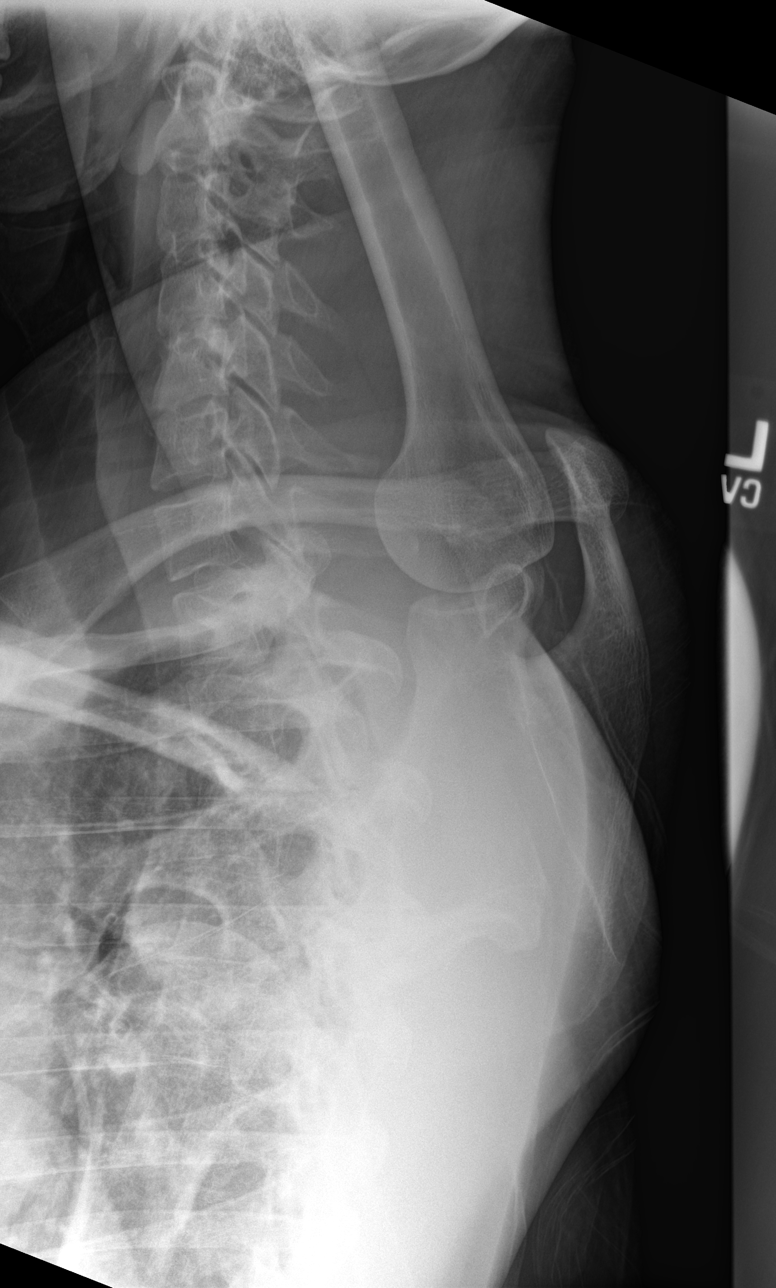

[4 of 4 positions shown; findings below may reference images not displayed]

FINDINGS: Normal anatomic alignment. No evidence for acute fracture or
dislocation. Preservation of the vertebral body and intervertebral
disc space heights. Visualized mediastinum and lung parenchyma is
unremarkable.
IMPRESSION: Grossly unremarkable thoracic spine.

## 2016-10-27 IMAGING — CR DG CERVICAL SPINE COMPLETE 4+V
1 series · 6 of 6 positions shown · non-contrast
Comparison: None.

CLINICAL DATA: Neck pain for past 3 weeks.  No known injury.

EXAM:
CERVICAL SPINE  4+ VIEWS

[Series 1: dg cervical spine complete · 0.14mm/px · 6 of 6 slices shown]
[im 1/6]
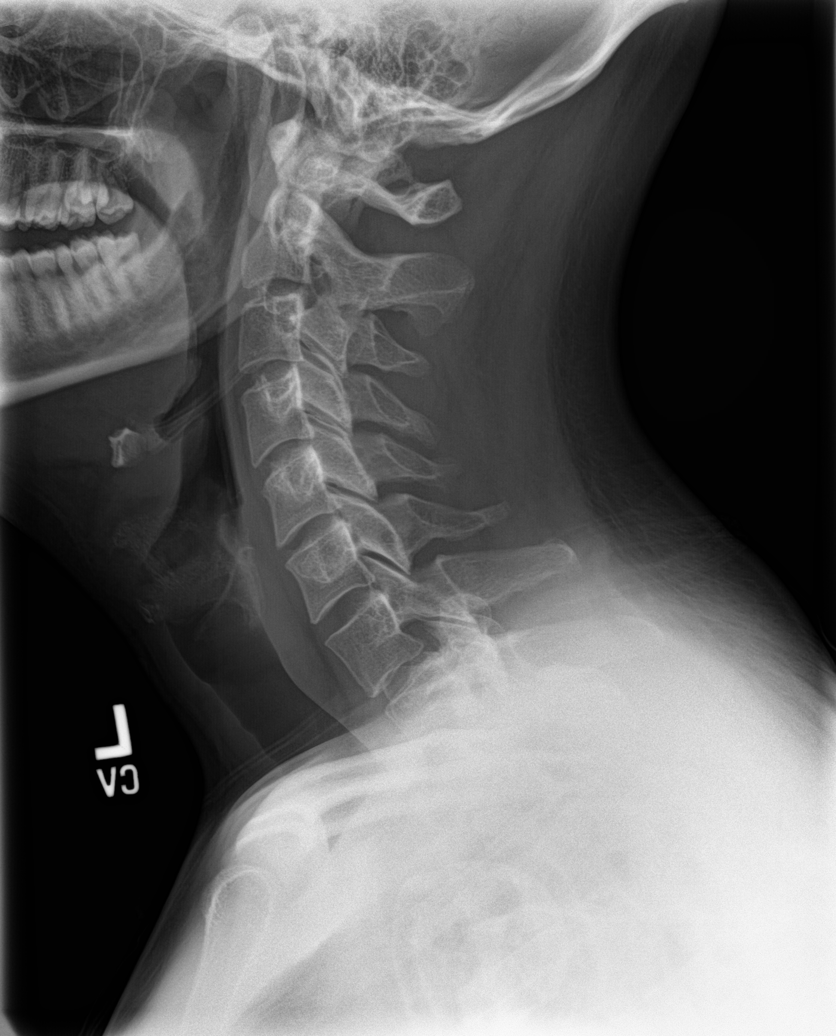
[im 2/6]
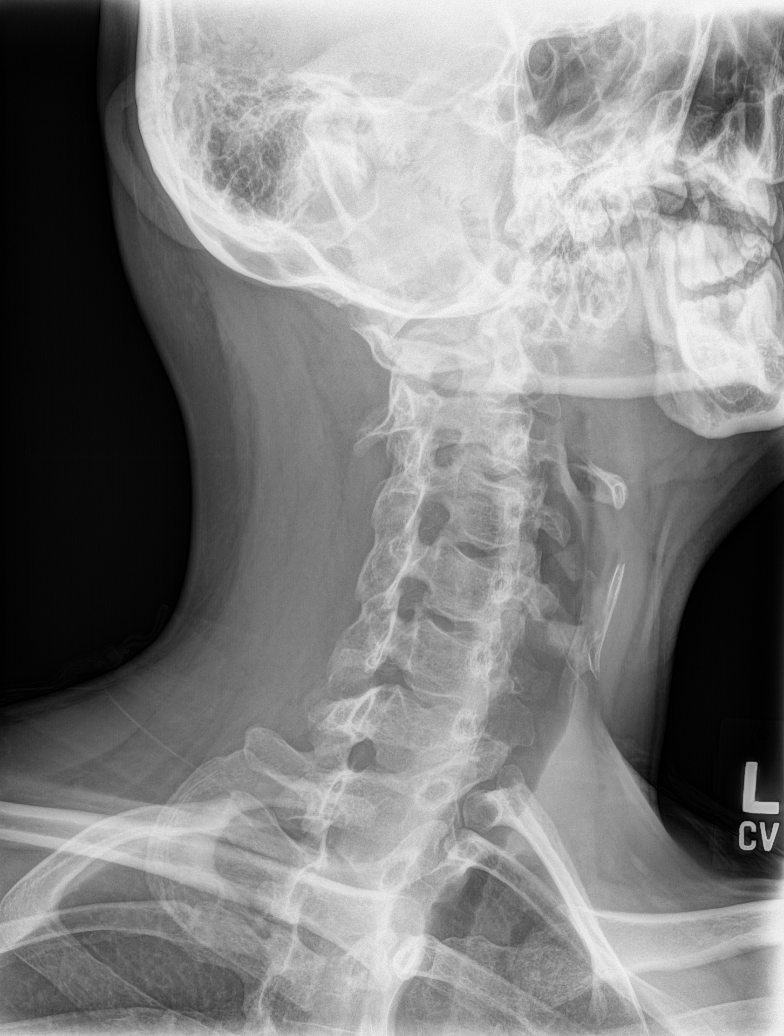
[im 3/6]
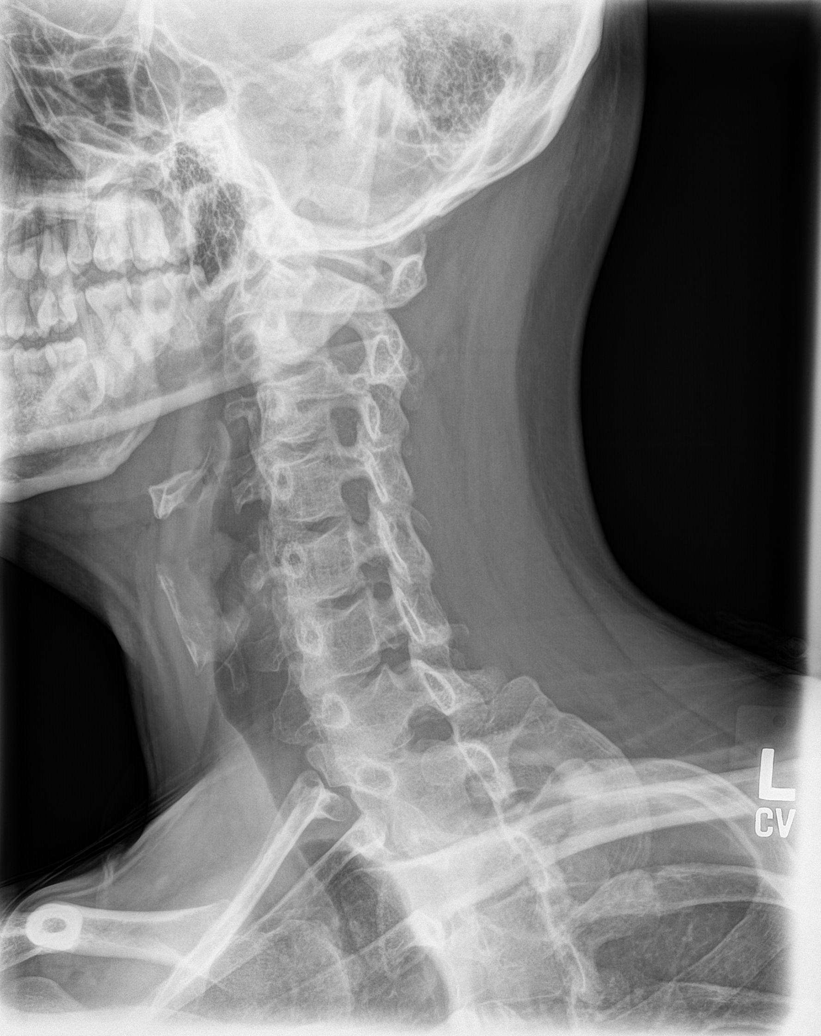
[im 4/6]
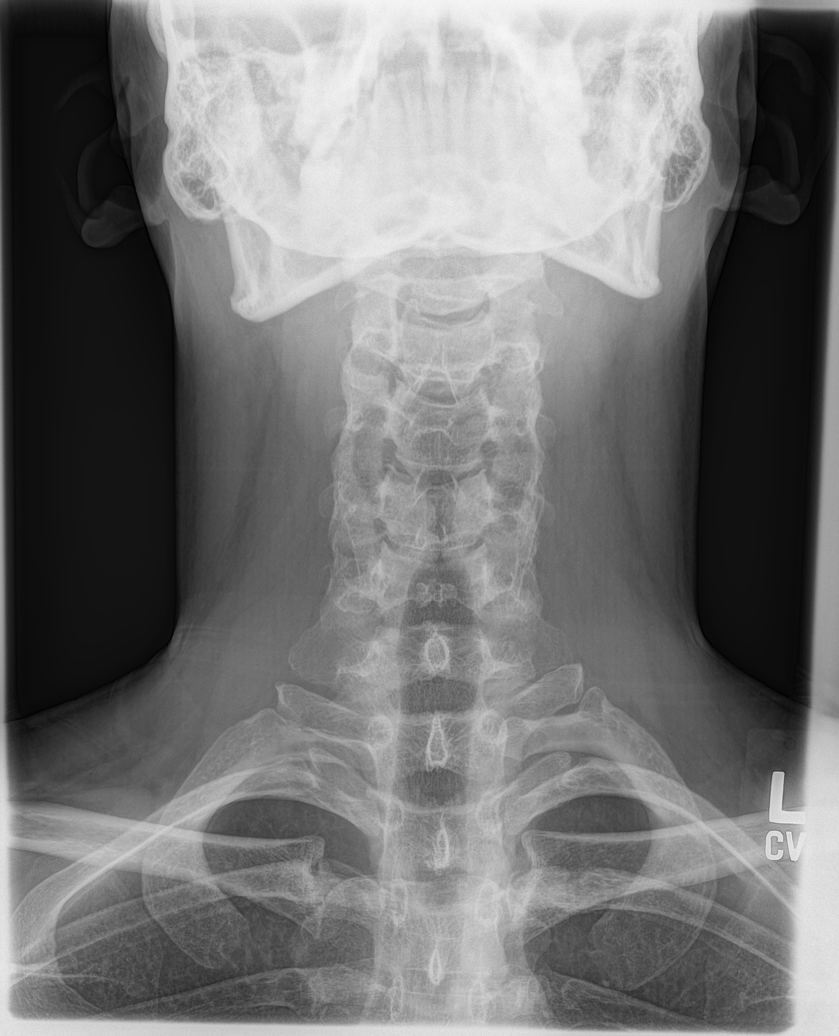
[im 5/6]
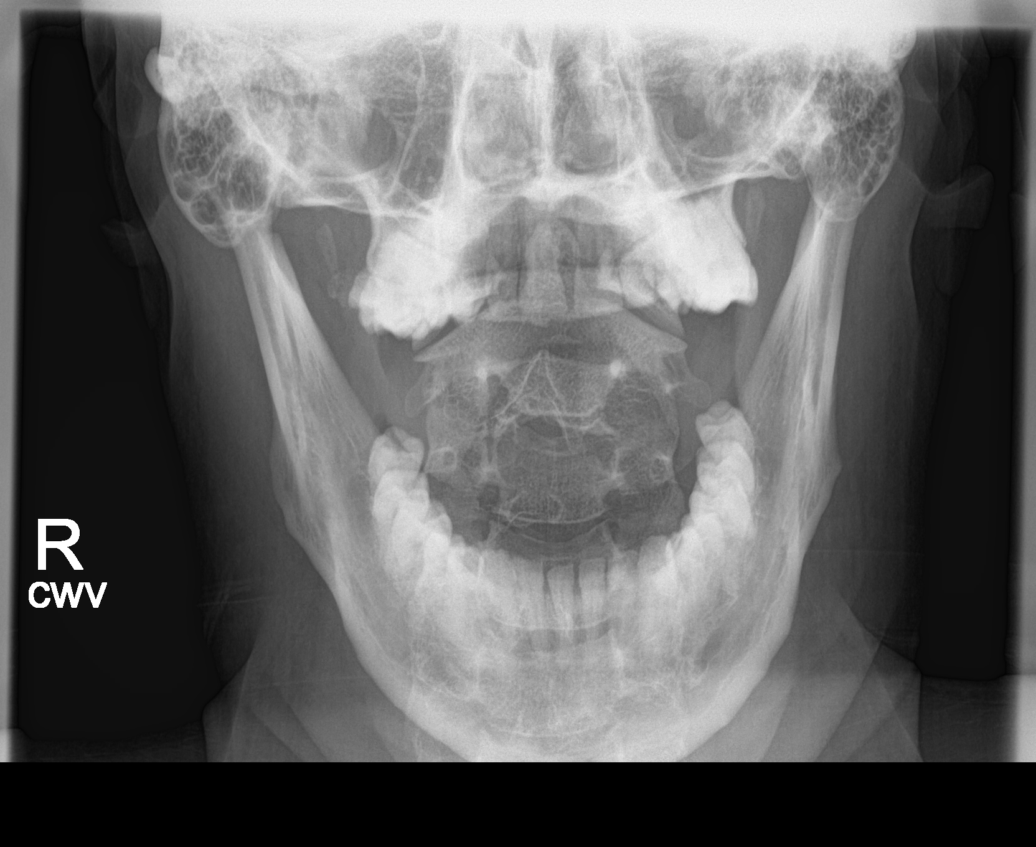
[im 6/6]
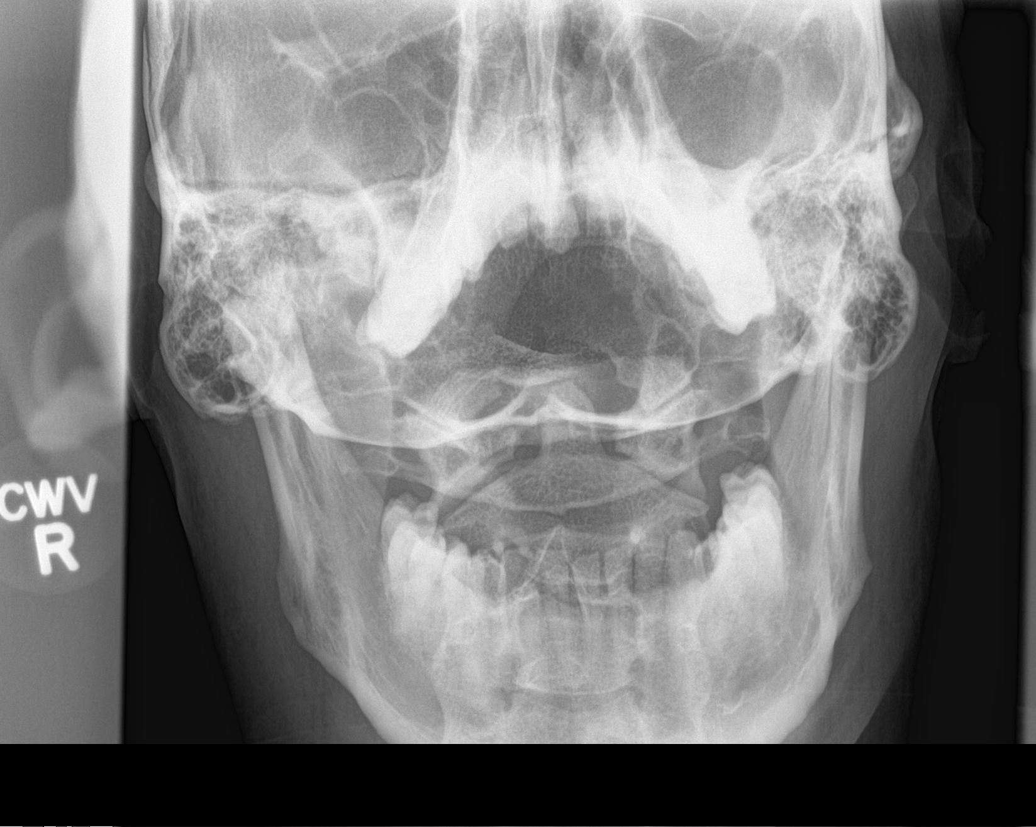

[6 of 6 positions shown; findings below may reference images not displayed]

FINDINGS: There is no evidence of cervical spine fracture or prevertebral soft
tissue swelling. Alignment is normal. No evidence of facet DJD. No
other significant bone abnormalities are identified.
IMPRESSION: Negative cervical spine radiographs.

## 2016-12-23 ENCOUNTER — Encounter: Payer: Self-pay | Admitting: Family Medicine

## 2016-12-23 ENCOUNTER — Ambulatory Visit (INDEPENDENT_AMBULATORY_CARE_PROVIDER_SITE_OTHER): Payer: BLUE CROSS/BLUE SHIELD | Admitting: Family Medicine

## 2016-12-23 VITALS — BP 118/74 | HR 93 | Temp 98.2°F | Resp 16 | Ht 72.5 in | Wt 213.9 lb

## 2016-12-23 DIAGNOSIS — R5381 Other malaise: Secondary | ICD-10-CM | POA: Diagnosis not present

## 2016-12-23 DIAGNOSIS — W57XXXA Bitten or stung by nonvenomous insect and other nonvenomous arthropods, initial encounter: Secondary | ICD-10-CM | POA: Diagnosis not present

## 2016-12-23 MED ORDER — DOXYCYCLINE HYCLATE 100 MG PO TABS
100.0000 mg | ORAL_TABLET | Freq: Two times a day (BID) | ORAL | 0 refills | Status: AC
Start: 2016-12-23 — End: 2016-12-30

## 2016-12-23 NOTE — Progress Notes (Signed)
BP 118/74   Pulse 93   Temp 98.2 F (36.8 C) (Oral)   Resp 16   Ht 6' 0.5" (1.842 m)   Wt 213 lb 14.4 oz (97 kg)   SpO2 98%   BMI 28.61 kg/m    Subjective:    Patient ID: David Foster, male    DOB: 11/28/1985, 31 y.o.   MRN: [id number]  HPI: David Foster is a 31 y.o. male  Chief Complaint  Patient presents with  . Annual Exam    HPI Patient had a tick bite on Sunday; he was [place], walked through the long grass; tick bite on the underside of penis under side of the head; swelled up the head; itchy and such, healing up now; no difficulty with urination; no swollen glands in the groin; fever on and off throughout the day; he felt lethargic and no appetite through the day; started shaving and had to sit and rest; slept the entire day; felt beter in the evening; slept all night long; slight headache yesterday; mild muscle pain, nothing severe; maybe a little neck stiffness, bur reached over funny yesterday, maybe muscular; no headaches right now, but had a mild one yesterday; no rash on arms and ankles no recent travel; went to [place] in Jan/Feb No burning with urination; does not think any tick is still embedded; found it the same day, maybe a few hours  Depression screen [facility] 2/9 12/23/2016 04/27/2015 03/25/2015  Decreased Interest 0 0 0  Down, Depressed, Hopeless 0 0 0  PHQ - 2 Score 0 0 0   Relevant past medical, surgical, family and social history reviewed Past Medical History:  Diagnosis Date  . Depression    Past Surgical History:  Procedure Laterality Date  . KNEE SURGERY Left 2013   Family History  Problem Relation Age of Onset  . Breast cancer Mother   . Hypertension Mother   . Colon cancer Maternal Aunt   . Anuerysm Maternal Aunt    Social History  Substance Use Topics  . Smoking status: Never Smoker  . Smokeless tobacco: Never Used  . Alcohol use 0.0 oz/week     Comment: rarely   Interim medical history since  last visit reviewed. Allergies and medications reviewed  Review of Systems Per HPI unless specifically indicated above     Objective:    BP 118/74   Pulse 93   Temp 98.2 F (36.8 C) (Oral)   Resp 16   Ht 6' 0.5" (1.842 m)   Wt 213 lb 14.4 oz (97 kg)   SpO2 98%   BMI 28.61 kg/m   Wt Readings from Last 3 Encounters:  12/23/16 213 lb 14.4 oz (97 kg)  04/27/15 205 lb 2 oz (93 kg)  03/25/15 201 lb 4 oz (91.3 kg)    Physical Exam  Constitutional: He appears well-developed and well-nourished. No distress.  Neck: Neck supple.  Cardiovascular: Normal rate and regular rhythm.   Pulmonary/Chest: Effort normal and breath sounds normal.  Abdominal: He exhibits no distension.  Genitourinary:  Genitourinary Comments: Patient declined exam  Skin: No rash noted.  Psychiatric: He does not exhibit a depressed mood.      Assessment & Plan:   Problem List Items Addressed This Visit    None    Visit Diagnoses    Tick bite, initial encounter    -  Primary   with symptoms of headache, malaise; will start doxycycline; opted to not  draw labs as it won't change my plan; tick and mosquito prevention; call if needed   [place]       suspect related to tick-borne illness; start doxy      Follow up plan: Return in about 4 weeks (around 01/20/2017) for complete physical, come fasting.  An after-visit summary was printed and given to the patient at check-out.  Please see the patient instructions which may contain other information and recommendations beyond what is mentioned above in the assessment and plan.  Meds ordered this encounter  Medications  . doxycycline (VIBRA-TABS) 100 MG tablet    Sig: Take 1 tablet (100 mg total) by mouth 2 (two) times daily.    Dispense:  14 tablet    Refill:  0   No orders of the defined types were placed in this encounter.

## 2016-12-23 NOTE — Progress Notes (Signed)
  Patient ID: Renard R Fick, male   DOB: 02/26/1986, 31 y.o.   MRN: [id number]   Subjective:   Dael R Gaccione is a 31 y.o. male here for a complete physical exam  Interim issues since last visit:  Past Medical History:  Diagnosis Date  . Depression    Past Surgical History:  Procedure Laterality Date  . KNEE SURGERY Left 2013   Family History  Problem Relation Age of Onset  . Breast cancer Mother   . Hypertension Mother   . Colon cancer Maternal Aunt   . Anuerysm Maternal Aunt    Social History  Substance Use Topics  . Smoking status: Never Smoker  . Smokeless tobacco: Never Used  . Alcohol use 0.0 oz/week     Comment: rarely   Review of Systems  Objective:   Vitals:   12/23/16 0831  BP: 118/74  Pulse: 93  Resp: 16  Temp: 98.2 F (36.8 C)  TempSrc: Oral  SpO2: 98%  Weight: 213 lb 14.4 oz (97 kg)  Height: 6' 0.5" (1.842 m)   Body mass index is 28.61 kg/m. Wt Readings from Last 3 Encounters:  12/23/16 213 lb 14.4 oz (97 kg)  04/27/15 205 lb 2 oz (93 kg)  03/25/15 201 lb 4 oz (91.3 kg)   Physical Exam  Assessment/Plan:   Problem List Items Addressed This Visit    None       No orders of the defined types were placed in this encounter.  No orders of the defined types were placed in this encounter.   Follow up plan: No Follow-up on file.  An After Visit Summary was printed and given to the patient. 

## 2016-12-23 NOTE — Progress Notes (Signed)
  Patient ID: David Foster, male   DOB: 24-Sep-1985, 31 y.o.   MRN: [id number]   Subjective:   David Foster is a 31 y.o. male here for a complete physical exam  Interim issues since last visit:  Past Medical History:  Diagnosis Date  . Depression    Past Surgical History:  Procedure Laterality Date  . KNEE SURGERY Left 2013   Family History  Problem Relation Age of Onset  . Breast cancer Mother   . Hypertension Mother   . Colon cancer Maternal Aunt   . Anuerysm Maternal Aunt    Social History  Substance Use Topics  . Smoking status: Never Smoker  . Smokeless tobacco: Never Used  . Alcohol use 0.0 oz/week     Comment: rarely   Review of Systems  Objective:   Vitals:   12/23/16 0831  BP: 118/74  Pulse: 93  Resp: 16  Temp: 98.2 F (36.8 C)  TempSrc: Oral  SpO2: 98%  Weight: 213 lb 14.4 oz (97 kg)  Height: 6' 0.5" (1.842 m)   Body mass index is 28.61 kg/m. Wt Readings from Last 3 Encounters:  12/23/16 213 lb 14.4 oz (97 kg)  04/27/15 205 lb 2 oz (93 kg)  03/25/15 201 lb 4 oz (91.3 kg)   Physical Exam  Assessment/Plan:   Problem List Items Addressed This Visit    None       No orders of the defined types were placed in this encounter.  No orders of the defined types were placed in this encounter.   Follow up plan: No Follow-up on file.  An After Visit Summary was printed and given to the patient.

## 2016-12-23 NOTE — Patient Instructions (Addendum)
Start the doxycyline Please do eat yogurt daily or take a probiotic daily for the next month We want to replace the healthy germs in the gut If you notice foul, watery diarrhea in the next two months, schedule an appointment RIGHT AWAY  Tick Bite Information, Adult Ticks are insects that draw blood for food. Most ticks live in shrubs and grassy areas. They climb onto people and animals that brush against the leaves and grasses that they rest on. Then they bite, attaching themselves to the skin. Most ticks are harmless, but some ticks carry germs that can spread to a person through a bite and cause a disease. To reduce your risk of getting a disease from a tick bite, it is important to take steps to prevent tick bites. It is also important to check for ticks after being outdoors. If you find that a tick has attached to you, watch for symptoms of disease. How can I prevent tick bites? Take these steps to help prevent tick bites when you are outdoors in an area where ticks are found:  Use insect repellent that has DEET (20% or higher), picaridin, or IR3535 in it. Use it on:  Skin that is showing.  The top of your boots.  Your pant legs.  Your sleeve cuffs.  For repellent products that contain permethrin, follow product instructions. Use these products on:  Clothing.  Gear.  Boots.  Tents.  Wear protective clothing. Long sleeves and long pants offer the best protection from ticks.  Wear light-colored clothing so you can see ticks more easily.  Tuck your pant legs into your socks.  If you go walking on a trail, stay in the middle of the trail so your skin, hair, and clothing do not touch the bushes.  Avoid walking through areas with long grass.  Check for ticks on your clothing, hair, and skin often while you are outside, and check again before you go inside. Make sure to check the places that ticks attach themselves most often. These places include the scalp, neck, armpits,  waist, groin, and joint areas. Ticks that carry a disease called Lyme disease have to be attached to the skin for 24-48 hours. Checking for ticks every day will lessen your risk of this and other diseases.  When you come indoors, wash your clothes and take a shower or a bath right away. Dry your clothes in a dryer on high heat for at least 60 minutes. This will kill any ticks in your clothes. What is the proper way to remove a tick? If you find a tick on your body, remove it as soon as possible. Removing a tick sooner rather than later can prevent germs from passing from the tick to your body. To remove a tick that is crawling on your skin but has not bitten:  Go outdoors and brush the tick off.  Remove the tick with tape or a lint roller. To remove a tick that is attached to your skin:  Wash your hands.  If you have latex gloves, put them on.  Use tweezers, curved forceps, or a tick-removal tool to gently grasp the tick as close to your skin and the tick's head as possible.  Gently pull with steady, upward pressure until the tick lets go. When removing the tick:  Take care to keep the tick's head attached to its body.  Do not twist or jerk the tick. This can make the tick's head or mouth break off.  Do not squeeze  or crush the tick's body. This could force disease-carrying fluids from the tick into your body. Do not try to remove a tick with heat, alcohol, petroleum jelly, or fingernail polish. Using these methods can cause the tick to salivate and regurgitate into your bloodstream, increasing your risk of getting a disease. What should I do after removing a tick?  Clean the bite area with soap and water, rubbing alcohol, or an iodine scrub.  If an antiseptic cream or ointment is available, apply a small amount to the bite site.  Wash and disinfect any instruments that you used to remove the tick. How should I dispose of a tick? To dispose of a live tick, use one of these  methods:  Place it in rubbing alcohol.  Place it in a sealed bag or container.  Wrap it tightly in tape.  Flush it down the toilet. Contact a health care provider if:  You have symptoms of a disease after a tick bite. Symptoms of a tick-borne disease can occur from moments after the tick bites to up to 30 days after a tick is removed. Symptoms include:  Muscle, joint, or bone pain.  Difficulty walking or moving your legs.  Numbness in the legs.  Paralysis.  Red rash around the tick bite area that is shaped like a target or a "bull's-eye."  Redness and swelling in the area of the tick bite.  Fever.  Repeated vomiting.  Diarrhea.  Weight loss.  Tender, swollen lymph glands.  Shortness of breath.  Cough.  Pain in the abdomen.  Headache.  Abnormal tiredness.  A change in your level of consciousness.  Confusion. Get help right away if:  You are not able to remove a tick.  A part of a tick breaks off and gets stuck in your skin.  Your symptoms get worse. Summary  Ticks may carry germs that can spread to a person through a bite and cause disease.  Wear protective clothing and use insect repellent to prevent tick bites. Follow product instructions.  If you find a tick on your body, remove it as soon as possible. If the tick is attached, do not try to remove with heat, alcohol, petroleum jelly, or fingernail polish.  Remove the attached tick using tweezers, curved forceps, or a tick-removal tool. Gently pull with steady, upward pressure until the tick lets go. Do not twist or jerk the tick. Do not squeeze or crush the tick's body.  If you have symptoms after being bitten by a tick, contact a health care provider. This information is not intended to replace advice given to you by your health care provider. Make sure you discuss any questions you have with your health care provider. Document Released: 08/19/2000 Document Revised: 06/03/2016 Document Reviewed:  06/03/2016 Elsevier Interactive Patient Education  2017 [employer].

## 2017-01-17 ENCOUNTER — Encounter: Payer: Self-pay | Admitting: Family Medicine

## 2017-01-17 ENCOUNTER — Ambulatory Visit (INDEPENDENT_AMBULATORY_CARE_PROVIDER_SITE_OTHER): Payer: BLUE CROSS/BLUE SHIELD | Admitting: Family Medicine

## 2017-01-17 DIAGNOSIS — Z0000 Encounter for general adult medical examination without abnormal findings: Secondary | ICD-10-CM | POA: Diagnosis not present

## 2017-01-17 NOTE — Patient Instructions (Addendum)
Return fasting for labs this week or next   Health Maintenance, Male A healthy lifestyle and preventive care is important for your health and wellness. Ask your health care provider about what schedule of regular examinations is right for you. What should I know about weight and diet?  Eat a Healthy Diet  Eat plenty of vegetables, fruits, whole grains, low-fat dairy products, and lean protein.  Do not eat a lot of foods high in solid fats, added sugars, or salt. Maintain a Healthy Weight  Regular exercise can help you achieve or maintain a healthy weight. You should:  Do at least 150 minutes of exercise each week. The exercise should increase your heart rate and make you sweat (moderate-intensity exercise).  Do strength-training exercises at least twice a week. Watch Your Levels of Cholesterol and Blood Lipids  Have your blood tested for lipids and cholesterol every 5 years starting at 32 years of age. If you are at high risk for heart disease, you should start having your blood tested when you are 32 years old. You may need to have your cholesterol levels checked more often if:  Your lipid or cholesterol levels are high.  You are older than 32 years of age.  You are at high risk for heart disease. What should I know about cancer screening? Many types of cancers can be detected early and may often be prevented. Lung Cancer  You should be screened every year for lung cancer if:  You are a current smoker who has smoked for at least 30 years.  You are a former smoker who has quit within the past 15 years.  Talk to your health care provider about your screening options, when you should start screening, and how often you should be screened. Colorectal Cancer  Routine colorectal cancer screening usually begins at 32 years of age and should be repeated every 5-10 years until you are 32 years old. You may need to be screened more often if early forms of precancerous polyps or small  growths are found. Your health care provider may recommend screening at an earlier age if you have risk factors for colon cancer.  Your health care provider may recommend using home test kits to check for hidden blood in the stool.  A small camera at the end of a tube can be used to examine your colon (sigmoidoscopy or colonoscopy). This checks for the earliest forms of colorectal cancer. Prostate and Testicular Cancer  Depending on your age and overall health, your health care provider may do certain tests to screen for prostate and testicular cancer.  Talk to your health care provider about any symptoms or concerns you have about testicular or prostate cancer. Skin Cancer  Check your skin from head to toe regularly.  Tell your health care provider about any new moles or changes in moles, especially if:  There is a change in a mole's size, shape, or color.  You have a mole that is larger than a pencil eraser.  Always use sunscreen. Apply sunscreen liberally and repeat throughout the day.  Protect yourself by wearing long sleeves, pants, a wide-brimmed hat, and sunglasses when outside. What should I know about heart disease, diabetes, and high blood pressure?  If you are 30-41 years of age, have your blood pressure checked every 3-5 years. If you are 34 years of age or older, have your blood pressure checked every year. You should have your blood pressure measured twice-once when you are at a hospital  or clinic, and once when you are not at a hospital or clinic. Record the average of the two measurements. To check your blood pressure when you are not at a hospital or clinic, you can use:  An automated blood pressure machine at a pharmacy.  A home blood pressure monitor.  Talk to your health care provider about your target blood pressure.  If you are between 8245-868 years old, ask your health care provider if you should take aspirin to prevent heart disease.  Have regular diabetes  screenings by checking your fasting blood sugar level.  If you are at a normal weight and have a low risk for diabetes, have this test once every three years after the age of 32.  If you are overweight and have a high risk for diabetes, consider being tested at a younger age or more often.  A one-time screening for abdominal aortic aneurysm (AAA) by ultrasound is recommended for men aged 65-75 years who are current or former smokers. What should I know about preventing infection? Hepatitis B  If you have a higher risk for hepatitis B, you should be screened for this virus. Talk with your health care provider to find out if you are at risk for hepatitis B infection. Hepatitis C  Blood testing is recommended for:  Everyone born from [birth year] through 1966.  Anyone with known risk factors for hepatitis C. Sexually Transmitted Diseases (STDs)  You should be screened each year for STDs including gonorrhea and chlamydia if:  You are sexually active and are younger than 32 years of age.  You are older than 32 years of age and your health care provider tells you that you are at risk for this type of infection.  Your sexual activity has changed since you were last screened and you are at an increased risk for chlamydia or gonorrhea. Ask your health care provider if you are at risk.  Talk with your health care provider about whether you are at high risk of being infected with HIV. Your health care provider may recommend a prescription medicine to help prevent HIV infection. What else can I do?  Schedule regular health, dental, and eye exams.  Stay current with your vaccines (immunizations).  Do not use any tobacco products, such as cigarettes, chewing tobacco, and e-cigarettes. If you need help quitting, ask your health care provider.  Limit alcohol intake to no more than 2 drinks per day. One drink equals 12 ounces of beer, 5 ounces of wine, or 1 ounces of hard liquor.  Do not use street  drugs.  Do not share needles.  Ask your health care provider for help if you need support or information about quitting drugs.  Tell your health care provider if you often feel depressed.  Tell your health care provider if you have ever been abused or do not feel safe at home. This information is not intended to replace advice given to you by your health care provider. Make sure you discuss any questions you have with your health care provider. Document Released: 02/18/2008 Document Revised: 04/20/2016 Document Reviewed: 05/26/2015 Elsevier Interactive Patient Education  2017 [employer]

## 2017-01-17 NOTE — Progress Notes (Signed)
Patient ID: David MuskratStephen R Foster, male   DOB: 1986/03/15, 31 y.o.   MRN: [id number]   Subjective:   David MuskratStephen R Foster is a 31 y.o. male here for a complete physical exam  Interim issues since last visit: USPSTF grade A and B recommendations Depression:  Dep[facility] screen [facility] 2/9 01/17/2017 12/23/2016 04/27/2015 03/25/2015  Decreased Interest 0 0 0 0  Down, Depressed, Hopeless 0 0 0 0  PHQ - 2 Score 0 0 0 0   Hypertension: well-controlled; hx of white coat HTN at times BP Readings from Last 3 Encounters:  01/17/17 116/72  12/23/16 118/74  04/27/15 122/80   Obesity: lost a pound+ Wt Readings from Last 3 Encounters:  01/17/17 212 lb 9.6 oz (96.4 kg)  12/23/16 213 lb 14.4 oz (97 kg)  04/27/15 205 lb 2 oz (93 kg)   BMI Readings from Last 3 Encounters:  01/17/17 29.65 kg/m  12/23/16 28.61 kg/m  04/27/15 27.82 kg/m    Alcohol: rare Tobacco use: no smoking HIV, hep B, hep C: declined Single STD testing and prevention (chl/gon/syphilis): declined Lipids: check today; nonfasting Lab Results  Component Value Date   CHOL 161 03/25/2015   Lab Results  Component Value Date   HDL 42 03/25/2015   Lab Results  Component Value Date   LDLCALC 109 (H) 03/25/2015   Lab Results  Component Value Date   TRIG 52 03/25/2015   Lab Results  Component Value Date   CHOLHDL 3.8 03/25/2015   No results found for: LDLDIRECT Glucose:  Glucose  Date Value Ref Range Status  03/25/2015 83 65 - 99 mg/dL Final   Colorectal cancer: not sure about family hx; aunt had ovarian cancer and another type of cancer, mother had breast cancer; no first degree relatives Prostate cancer: n/a No results found for: PSA Lung cancer:  n/a AAA: n/a Aspirin: n/a Diet: milk; needs more fruit and veggies Exercise: maybe 150 minutes a week, working at [employer], active at work Skin cancer: no worrisome moles   Past Medical History:  Diagnosis Date  . Depression    Past Surgical History:  Procedure Laterality  Date  . KNEE SURGERY Left 2013   Family History  Problem Relation Age of Onset  . Breast cancer Mother   . Hypertension Mother   . Colon cancer Maternal Aunt   . Anuerysm Maternal Aunt   . Diabetes Maternal Grandmother    Social History  Substance Use Topics  . Smoking status: Never Smoker  . Smokeless tobacco: Never Used  . Alcohol use 0.0 oz/week     Comment: rarely   Review of Systems  Objective:   Vitals:   01/17/17 0813  BP: 116/72  Pulse: 63  Resp: 16  Temp: 98.1 F (36.7 C)  TempSrc: Oral  SpO2: 98%  Weight: 212 lb 9.6 oz (96.4 kg)  Height: 5\' 11"  (1.803 m)   Body mass index is 29.65 kg/m. Wt Readings from Last 3 Encounters:  01/17/17 212 lb 9.6 oz (96.4 kg)  12/23/16 213 lb 14.4 oz (97 kg)  04/27/15 205 lb 2 oz (93 kg)   Physical Exam  Constitutional: He appears well-developed and well-nourished. No distress.  HENT:  Head: Normocephalic and atraumatic.  Nose: Nose normal.  Mouth/Throat: Oropharynx is clear and moist.  Eyes: EOM are normal. No scleral icterus.  Neck: No JVD present. No thyromegaly present.  Cardiovascular: Normal rate, regular rhythm and normal heart sounds.   Pulmonary/Chest: Effort normal and breath sounds normal. No respiratory distress. He  has no wheezes. He has no rales.  Abdominal: Soft. Bowel sounds are normal. He exhibits no distension. There is no tenderness. There is no guarding.  Musculoskeletal: Normal range of motion. He exhibits no edema.  Lymphadenopathy:    He has no cervical adenopathy.  Neurological: He is alert. He displays normal reflexes. He exhibits normal muscle tone. Coordination normal.  Skin: Skin is warm and dry. No rash noted. He is not diaphoretic. No erythema. No pallor.  Psychiatric: He has a normal mood and affect. His behavior is normal. Judgment and thought content normal.    Assessment/Plan:   Problem List Items Addressed This Visit      Other   Annual physical exam    USPSTF grade A and B  recommendations reviewed with patient; age-appropriate recommendations, preventive care, screening tests, etc discussed and encouraged; healthy living encouraged; see AVS for patient education given to patient       Relevant Orders   CBC with Differential/Platelet   COMPLETE METABOLIC PANEL WITH GFR   Lipid panel      No orders of the defined types were placed in this encounter.  Orders Placed This Encounter  Procedures  . CBC with Differential/Platelet  . COMPLETE METABOLIC PANEL WITH GFR  . Lipid panel    Follow up plan: Return in about 1 year (around 01/17/2018) for complete physical.  An After Visit Summary was printed and given to the patient.

## 2017-01-17 NOTE — Assessment & Plan Note (Signed)
USPSTF grade A and B recommendations reviewed with patient; age-appropriate recommendations, preventive care, screening tests, etc discussed and encouraged; healthy living encouraged; see AVS for patient education given to patient  

## 2017-01-23 LAB — CBC WITH DIFFERENTIAL/PLATELET
Basophils Absolute: 46 cells/uL (ref 0–200)
Basophils Relative: 1 %
EOS PCT: 2 %
Eosinophils Absolute: 92 cells/uL (ref 15–500)
HCT: 47.5 % (ref 38.5–50.0)
Hemoglobin: 16.5 g/dL (ref 13.2–17.1)
Lymphocytes Relative: 45 %
Lymphs Abs: 2070 cells/uL (ref 850–3900)
MCH: 30.4 pg (ref 27.0–33.0)
MCHC: 34.7 g/dL (ref 32.0–36.0)
MCV: 87.5 fL (ref 80.0–100.0)
MPV: 9 fL (ref 7.5–12.5)
Monocytes Absolute: 368 cells/uL (ref 200–950)
Monocytes Relative: 8 %
NEUTROS PCT: 44 %
Neutro Abs: 2024 cells/uL (ref 1500–7800)
Platelets: 231 10*3/uL (ref 140–400)
RBC: 5.43 MIL/uL (ref 4.20–5.80)
RDW: 13 % (ref 11.0–15.0)
WBC: 4.6 10*3/uL (ref 3.8–10.8)

## 2017-01-23 LAB — COMPLETE METABOLIC PANEL WITH GFR
ALT: 21 U/L (ref 9–46)
AST: 17 U/L (ref 10–40)
Albumin: 4.3 g/dL (ref 3.6–5.1)
Alkaline Phosphatase: 42 U/L (ref 40–115)
BUN: 10 mg/dL (ref 7–25)
CALCIUM: 9.3 mg/dL (ref 8.6–10.3)
CHLORIDE: 104 mmol/L (ref 98–110)
CO2: 31 mmol/L (ref 20–31)
Creat: 0.99 mg/dL (ref 0.60–1.35)
GFR, Est African American: >89 mL/min (ref >=60)
GFR, Est Non African American: >89 mL/min (ref >=60)
GLUCOSE: 89 mg/dL (ref 65–99)
POTASSIUM: 5 mmol/L (ref 3.5–5.3)
SODIUM: 141 mmol/L (ref 135–146)
Total Bilirubin: 0.7 mg/dL (ref 0.2–1.2)
Total Protein: 6.6 g/dL (ref 6.1–8.1)

## 2017-01-23 LAB — LIPID PANEL
Cholesterol: 162 mg/dL (ref <200)
HDL: 45 mg/dL (ref >40)
LDL Cholesterol: 107 mg/dL — ABNORMAL HIGH (ref <100)
Total CHOL/HDL Ratio: 3.6 Ratio (ref <5.0)
Triglycerides: 48 mg/dL (ref <150)
VLDL: 10 mg/dL (ref <30)

## 2018-01-19 ENCOUNTER — Encounter: Payer: BLUE CROSS/BLUE SHIELD | Admitting: Family Medicine

## 2018-02-27 ENCOUNTER — Encounter: Payer: Self-pay | Admitting: Family Medicine

## 2018-02-27 ENCOUNTER — Ambulatory Visit: Payer: Self-pay | Admitting: Family Medicine

## 2018-02-27 VITALS — BP 126/84 | HR 81 | Temp 98.7°F | Resp 12 | Ht 71.0 in | Wt 226.0 lb

## 2018-02-27 DIAGNOSIS — E669 Obesity, unspecified: Secondary | ICD-10-CM | POA: Insufficient documentation

## 2018-02-27 DIAGNOSIS — Z0000 Encounter for general adult medical examination without abnormal findings: Secondary | ICD-10-CM

## 2018-02-27 LAB — LIPID PANEL
Cholesterol: 209 mg/dL — ABNORMAL HIGH (ref <200)
HDL: 39 mg/dL — AB (ref >40)
LDL CHOLESTEROL (CALC): 151 mg/dL — AB
Non-HDL Cholesterol (Calc): 170 mg/dL (calc) — ABNORMAL HIGH (ref <130)
Total CHOL/HDL Ratio: 5.4 (calc) — ABNORMAL HIGH (ref <5.0)
Triglycerides: 83 mg/dL (ref <150)

## 2018-02-27 LAB — COMPLETE METABOLIC PANEL WITH GFR
AG Ratio: 1.6 (calc) (ref 1.0–2.5)
ALBUMIN MSPROF: 4.4 g/dL (ref 3.6–5.1)
ALKALINE PHOSPHATASE (APISO): 49 U/L (ref 40–115)
ALT: 29 U/L (ref 9–46)
AST: 20 U/L (ref 10–40)
BILIRUBIN TOTAL: 0.7 mg/dL (ref 0.2–1.2)
BUN: 11 mg/dL (ref 7–25)
CHLORIDE: 101 mmol/L (ref 98–110)
CO2: 31 mmol/L (ref 20–32)
Calcium: 9.7 mg/dL (ref 8.6–10.3)
Creat: 1.02 mg/dL (ref 0.60–1.35)
GFR, Est African American: 112 mL/min/{1.73_m2} (ref >=60)
GFR, Est Non African American: 97 mL/min/{1.73_m2} (ref >=60)
GLUCOSE: 90 mg/dL (ref 65–99)
Globulin: 2.7 g/dL (calc) (ref 1.9–3.7)
Potassium: 4.2 mmol/L (ref 3.5–5.3)
Sodium: 137 mmol/L (ref 135–146)
Total Protein: 7.1 g/dL (ref 6.1–8.1)

## 2018-02-27 NOTE — Progress Notes (Signed)
Patient ID: AYCE PIETRZYK, male   DOB: 04-Jun-1986, 32 y.o.   MRN: [id number]   Subjective:   David Foster is a 32 y.o. male here for a complete physical exam  Interim issues since last visit: he went skiing right before Christmas last year; was sure stuff was broken, nothing found on xrays, may have damaged the cartilage; no pneumonia  USPSTF grade A and B recommendations Depression:  Depression screen [facility] 2/9 02/27/2018 02/27/2018 01/17/2017 12/23/2016 04/27/2015  Decreased Interest 0 0 0 0 0  Down, Depressed, Hopeless 0 0 0 0 0  PHQ - 2 Score 0 0 0 0 0  Altered sleeping 0 - - - -  Tired, decreased energy 0 - - - -  Change in appetite 0 - - - -  Feeling bad or failure about yourself  0 - - - -  Trouble concentrating 0 - - - -  Moving slowly or fidgety/restless 0 - - - -  Suicidal thoughts 0 - - - -  PHQ-9 Score 0 - - - -  Difficult doing work/chores Not difficult at all - - - -   Hypertension: BP Readings from Last 3 Encounters:  02/27/18 126/84  01/17/17 116/72  12/23/16 118/74   Obesity: Wt Readings from Last 3 Encounters:  02/27/18 226 lb (102.5 kg)  01/17/17 212 lb 9.6 oz (96.4 kg)  12/23/16 213 lb 14.4 oz (97 kg)   BMI Readings from Last 3 Encounters:  02/27/18 31.52 kg/m  01/17/17 29.65 kg/m  12/23/16 28.61 kg/m    Immunizations: UTD tetanus Skin cancer: no worrisome moles; mole on the posterior right ankle benign Lung cancer:  nonsmoker Prostate cancer: start at age 32; no fam hx No results found for: PSA Colorectal cancer: no fam hx Diet: room for improvement Exercise: pretty active, but not as active as when he worked for [employer], semi-office job, Teaching laboratory technician and receiving clerk; started working out again, but got some pain in the foot and resting for now Alcohol:    [facility]/27/2018 in [facility]  AUDIT-C Score  1     Tobacco use: no HIV, hep B, hep C: not interested STD testing and prevention (chl/gon/syphilis): not  interested Glucose:  Glucose  Date Value Ref Range Status  03/25/2015 83 65 - 99 mg/dL Final   Glucose, Bld  Date Value Ref Range Status  01/23/2017 89 65 - 99 mg/dL Final   Lipids:  Lab Results  Component Value Date   CHOL 162 01/23/2017   CHOL 161 03/25/2015   Lab Results  Component Value Date   HDL 45 01/23/2017   HDL 42 03/25/2015   Lab Results  Component Value Date   LDLCALC 107 (H) 01/23/2017   LDLCALC 109 (H) 03/25/2015   Lab Results  Component Value Date   TRIG 48 01/23/2017   TRIG 52 03/25/2015   Lab Results  Component Value Date   CHOLHDL 3.6 01/23/2017   CHOLHDL 3.8 03/25/2015   No results found for: LDLDIRECT   Past Medical History:  Diagnosis Date  . Depression    Past Surgical History:  Procedure Laterality Date  . KNEE SURGERY Left 2013   Family History  Problem Relation Age of Onset  . Breast cancer Mother   . Hypertension Mother   . Colon cancer Maternal Aunt   . Anuerysm Maternal Aunt   . Diabetes Maternal Grandmother    Social History   Tobacco Use  . Smoking status: Never Smoker  .  Smokeless tobacco: Never Used  Substance Use Topics  . Alcohol use: Yes    Alcohol/week: 0.0 oz    Comment: rarely  . Drug use: No   Review of Systems  Objective:   Vitals:   02/27/18 1127  BP: 126/84  Pulse: 81  Resp: 12  Temp: 98.7 F (37.1 C)  TempSrc: Oral  SpO2: 98%  Weight: 226 lb (102.5 kg)  Height: 5\' 11"  (1.803 m)   Body mass index is 31.52 kg/m. Wt Readings from Last 3 Encounters:  02/27/18 226 lb (102.5 kg)  01/17/17 212 lb 9.6 oz (96.4 kg)  12/23/16 213 lb 14.4 oz (97 kg)   Physical Exam  Constitutional: He appears well-developed and well-nourished. No distress.  obese  HENT:  Head: Normocephalic and atraumatic.  Right Ear: Tympanic membrane and ear canal normal.  Left Ear: Tympanic membrane and ear canal normal.  Nose: Nose normal.  Mouth/Throat: Oropharynx is clear and moist and mucous membranes are normal.   Eyes: EOM are normal. No scleral icterus.  Neck: No JVD present. No thyromegaly present.  Cardiovascular: Normal rate, regular rhythm and normal heart sounds.  Pulmonary/Chest: Effort normal and breath sounds normal. No respiratory distress. He has no wheezes. He has no rales.  Abdominal: Soft. Bowel sounds are normal. He exhibits no distension. There is no tenderness. There is no guarding.  Musculoskeletal: Normal range of motion. He exhibits no edema.  Lymphadenopathy:    He has no cervical adenopathy.  Neurological: He is alert. He displays normal reflexes. He exhibits normal muscle tone. Coordination normal.  Skin: Skin is warm and dry. No rash noted. He is not diaphoretic. No erythema. No pallor.  Psychiatric: He has a normal mood and affect. His behavior is normal. Judgment and thought content normal.    Assessment/Plan:   Problem List Items Addressed This Visit      Other   Preventative health care - Primary    USPSTF grade A and B recommendations reviewed with patient; age-appropriate recommendations, preventive care, screening tests, etc discussed and encouraged; healthy living encouraged; see AVS for patient education given to patient       Relevant Orders   COMPLETE METABOLIC PANEL WITH GFR   Lipid panel   Obesity (BMI 30.0-34.9)    Discussed barriers to weight loss; he politely declined referral to nutritionist; he'll work on this, watch diet, increase activity as injury allows; see AVS         No orders of the defined types were placed in this encounter.  Orders Placed This Encounter  Procedures  . COMPLETE METABOLIC PANEL WITH GFR  . Lipid panel    Follow up plan: Return in about 1 year (around 02/28/2019) for complete physical.  An After Visit Summary was printed and given to the patient.

## 2018-02-27 NOTE — Patient Instructions (Addendum)
Check out the information at familydoctor.org entitled "Nutrition for Weight Loss: What You Need to Know about Fad Diets" Try to lose between 1-2 pounds per week by taking in fewer calories and burning off more calories You can succeed by limiting portions, limiting foods dense in calories and fat, becoming more active, and drinking 8 glasses of water a day (64 ounces) Don't skip meals, especially breakfast, as skipping meals may alter your metabolism Do not use over-the-counter weight loss pills or gimmicks that claim rapid weight loss A healthy BMI (or body mass index) is between 18.5 and 24.9 You can calculate your ideal BMI at the NIH website http://www.nhlbi.nih.gov/health/educational/lose_wt/BMI/bmicalc.htm  Preventing Unhealthy Weight Gain, Adult Staying at a healthy weight is important. When fat builds up in your body, you may become overweight or obese. These conditions put you at greater risk for developing certain health problems, such as heart disease, diabetes, sleeping problems, joint problems, and some cancers. Unhealthy weight gain is often the result of making unhealthy choices in what you eat. It is also a result of not getting enough exercise. You can make changes to your lifestyle to prevent obesity and stay as healthy as possible. What nutrition changes can be made? To maintain a healthy weight and prevent obesity:  Eat only as much as your body needs. To do this: ? Pay attention to signs that you are hungry or full. Stop eating as soon as you feel full. ? If you feel hungry, try drinking water first. Drink enough water so your urine is clear or pale yellow. ? Eat smaller portions. ? Look at serving sizes on food labels. Most foods contain more than one serving per container. ? Eat the recommended amount of calories for your gender and activity level. While most active people should eat around 2,000 calories per day, if you are trying to lose weight or are not very active, you  main need to eat less calories. Talk to your health care provider or dietitian about how many calories you should eat each day.  Choose healthy foods, such as: ? Fruits and vegetables. Try to fill at least half of your plate at each meal with fruits and vegetables. ? Whole grains, such as whole wheat bread, brown rice, and quinoa. ? Lean meats, such as chicken or fish. ? Other healthy proteins, such as beans, eggs, or tofu. ? Healthy fats, such as nuts, seeds, fatty fish, and olive oil. ? Low-fat or fat-free dairy.  Check food labels and avoid food and drinks that: ? Are high in calories. ? Have added sugar. ? Are high in sodium. ? Have saturated fats or trans fats.  Limit how much you eat of the following foods: ? Prepackaged meals. ? Fast food. ? Fried foods. ? Processed meat, such as bacon, sausage, and deli meats. ? Fatty cuts of red meat and poultry with skin.  Cook foods in healthier ways, such as by baking, broiling, or grilling.  When grocery shopping, try to shop around the outside of the store. This helps you buy mostly fresh foods and avoid canned and prepackaged foods.  What lifestyle changes can be made?  Exercise at least 30 minutes 5 or more days each week. Exercising includes brisk walking, yard work, biking, running, swimming, and team sports like basketball and soccer. Ask your health care provider which exercises are safe for you.  Do not use any products that contain nicotine or tobacco, such as cigarettes and e-cigarettes. If you need help quitting, ask   your health care provider.  Limit alcohol intake to no more than 1 drink a day for nonpregnant women and 2 drinks a day for men. One drink equals 12 oz of beer, 5 oz of wine, or 1 oz of hard liquor.  Try to get 7-9 hours of sleep each night. What other changes can be made?  Keep a food and activity journal to keep track of: ? What you ate and how many calories you had. Remember to count sauces, dressings,  and side dishes. ? Whether you were active, and what exercises you did. ? Your calorie, weight, and activity goals.  Check your weight regularly. Track any changes. If you notice you have gained weight, make changes to your diet or activity routine.  Avoid taking weight-loss medicines or supplements. Talk to your health care provider before starting any new medicine or supplement.  Talk to your health care provider before trying any new diet or exercise plan. Why are these changes important? Eating healthy, staying active, and having healthy habits not only help prevent obesity, they also:  Help you to manage stress and emotions.  Help you to connect with friends and family.  Improve your self-esteem.  Improve your sleep.  Prevent long-term health problems.  What can happen if changes are not made? Being obese or overweight can cause you to develop joint or bone problems, which can make it hard for you to stay active or do activities you enjoy. Being obese or overweight also puts stress on your heart and lungs and can lead to health problems like diabetes, heart disease, and some cancers. Where to find more information: Talk with your health care provider or a dietitian about healthy eating and healthy lifestyle choices. You may also find other information through these resources:  [place] Department of Agriculture MyPlate: www.choosemyplate.gov  American Heart Association: www.heart.org  Centers for Disease Control and Prevention: www.cdc.gov  Summary  Staying at a healthy weight is important. It helps prevent certain diseases and health problems, such as heart disease, diabetes, joint problems, sleep disorders, and some cancers.  Being obese or overweight can cause you to develop joint or bone problems, which can make it hard for you to stay active or do activities you enjoy.  You can prevent unhealthy weight gain by eating a healthy diet, exercising regularly, not smoking,  limiting alcohol, and getting enough sleep.  Talk with your health care provider or a dietitian for guidance about healthy eating and healthy lifestyle choices. This information is not intended to replace advice given to you by your health care provider. Make sure you discuss any questions you have with your health care provider. Document Released: 08/23/2016 Document Revised: 09/28/2016 Document Reviewed: 09/28/2016 Elsevier Interactive Patient Education  2018 [employer]  Health Maintenance, Male A healthy lifestyle and preventive care is important for your health and wellness. Ask your health care provider about what schedule of regular examinations is right for you. What should I know about weight and diet? Eat a Healthy Diet  Eat plenty of vegetables, fruits, whole grains, low-fat dairy products, and lean protein.  Do not eat a lot of foods high in solid fats, added sugars, or salt.  Maintain a Healthy Weight Regular exercise can help you achieve or maintain a healthy weight. You should:  Do at least 150 minutes of exercise each week. The exercise should increase your heart rate and make you sweat (moderate-intensity exercise).  Do strength-training exercises at least twice a week.  Watch Your   Levels of Cholesterol and Blood Lipids  Have your blood tested for lipids and cholesterol every 5 years starting at 33 years of age. If you are at high risk for heart disease, you should start having your blood tested when you are 33 years old. You may need to have your cholesterol levels checked more often if: ? Your lipid or cholesterol levels are high. ? You are older than 33 years of age. ? You are at high risk for heart disease.  What should I know about cancer screening? Many types of cancers can be detected early and may often be prevented. Lung Cancer  You should be screened every year for lung cancer if: ? You are a current smoker who has smoked for at least 30 years. ? You  are a former smoker who has quit within the past 15 years.  Talk to your health care provider about your screening options, when you should start screening, and how often you should be screened.  Colorectal Cancer  Routine colorectal cancer screening usually begins at 33 years of age and should be repeated every 5-10 years until you are 33 years old. You may need to be screened more often if early forms of precancerous polyps or small growths are found. Your health care provider may recommend screening at an earlier age if you have risk factors for colon cancer.  Your health care provider may recommend using home test kits to check for hidden blood in the stool.  A small camera at the end of a tube can be used to examine your colon (sigmoidoscopy or colonoscopy). This checks for the earliest forms of colorectal cancer.  Prostate and Testicular Cancer  Depending on your age and overall health, your health care provider may do certain tests to screen for prostate and testicular cancer.  Talk to your health care provider about any symptoms or concerns you have about testicular or prostate cancer.  Skin Cancer  Check your skin from head to toe regularly.  Tell your health care provider about any new moles or changes in moles, especially if: ? There is a change in a mole's size, shape, or color. ? You have a mole that is larger than a pencil eraser.  Always use sunscreen. Apply sunscreen liberally and repeat throughout the day.  Protect yourself by wearing long sleeves, pants, a wide-brimmed hat, and sunglasses when outside.  What should I know about heart disease, diabetes, and high blood pressure?  If you are 18-39 years of age, have your blood pressure checked every 3-5 years. If you are 40 years of age or older, have your blood pressure checked every year. You should have your blood pressure measured twice-once when you are at a hospital or clinic, and once when you are not at a  hospital or clinic. Record the average of the two measurements. To check your blood pressure when you are not at a hospital or clinic, you can use: ? An automated blood pressure machine at a pharmacy. ? A home blood pressure monitor.  Talk to your health care provider about your target blood pressure.  If you are between 45-79 years old, ask your health care provider if you should take aspirin to prevent heart disease.  Have regular diabetes screenings by checking your fasting blood sugar level. ? If you are at a normal weight and have a low risk for diabetes, have this test once every three years after the age of 45. ? If you are overweight   and have a high risk for diabetes, consider being tested at a younger age or more often.  A one-time screening for abdominal aortic aneurysm (AAA) by ultrasound is recommended for men aged 65-75 years who are current or former smokers. What should I know about preventing infection? Hepatitis B If you have a higher risk for hepatitis B, you should be screened for this virus. Talk with your health care provider to find out if you are at risk for hepatitis B infection. Hepatitis C Blood testing is recommended for:  Everyone born from 1945 through 1965.  Anyone with known risk factors for hepatitis C.  Sexually Transmitted Diseases (STDs)  You should be screened each year for STDs including gonorrhea and chlamydia if: ? You are sexually active and are younger than 33 years of age. ? You are older than 33 years of age and your health care provider tells you that you are at risk for this type of infection. ? Your sexual activity has changed since you were last screened and you are at an increased risk for chlamydia or gonorrhea. Ask your health care provider if you are at risk.  Talk with your health care provider about whether you are at high risk of being infected with HIV. Your health care provider may recommend a prescription medicine to help prevent  HIV infection.  What else can I do?  Schedule regular health, dental, and eye exams.  Stay current with your vaccines (immunizations).  Do not use any tobacco products, such as cigarettes, chewing tobacco, and e-cigarettes. If you need help quitting, ask your health care provider.  Limit alcohol intake to no more than 2 drinks per day. One drink equals 12 ounces of beer, 5 ounces of wine, or 1 ounces of hard liquor.  Do not use street drugs.  Do not share needles.  Ask your health care provider for help if you need support or information about quitting drugs.  Tell your health care provider if you often feel depressed.  Tell your health care provider if you have ever been abused or do not feel safe at home. This information is not intended to replace advice given to you by your health care provider. Make sure you discuss any questions you have with your health care provider. Document Released: 02/18/2008 Document Revised: 04/20/2016 Document Reviewed: 05/26/2015 Elsevier Interactive Patient Education  2018 [employer]  

## 2018-02-27 NOTE — Assessment & Plan Note (Signed)
Discussed barriers to weight loss; he politely declined referral to nutritionist; he'll work on this, watch diet, increase activity as injury allows; see AVS

## 2018-02-27 NOTE — Assessment & Plan Note (Signed)
USPSTF grade A and B recommendations reviewed with patient; age-appropriate recommendations, preventive care, screening tests, etc discussed and encouraged; healthy living encouraged; see AVS for patient education given to patient  

## 2018-02-28 ENCOUNTER — Other Ambulatory Visit: Payer: Self-pay | Admitting: Family Medicine

## 2018-02-28 DIAGNOSIS — E786 Lipoprotein deficiency: Secondary | ICD-10-CM

## 2018-02-28 DIAGNOSIS — E785 Hyperlipidemia, unspecified: Secondary | ICD-10-CM | POA: Insufficient documentation

## 2018-02-28 DIAGNOSIS — E782 Mixed hyperlipidemia: Secondary | ICD-10-CM

## 2018-02-28 NOTE — Progress Notes (Signed)
Recheck fasting lipids after 12 weeks of TLC

## 2018-02-28 NOTE — Assessment & Plan Note (Signed)
Check lipids after 12 weeks of TLC 

## 2018-02-28 NOTE — Assessment & Plan Note (Signed)
Check lipids after 12 weeks of TLC

## 2019-03-04 ENCOUNTER — Encounter: Payer: Self-pay | Admitting: Nurse Practitioner

## 2019-09-30 ENCOUNTER — Other Ambulatory Visit: Payer: Self-pay

## 2019-09-30 DIAGNOSIS — Z20822 Contact with and (suspected) exposure to covid-19: Secondary | ICD-10-CM

## 2019-10-01 LAB — NOVEL CORONAVIRUS, NAA: SARS-CoV-2, NAA: NOT DETECTED

## 2019-12-18 NOTE — Progress Notes (Signed)
Name: David Foster   MRN: [id number]    DOB: 01-07-1986   Date:12/19/2019       Progress Note  Subjective  Chief Complaint  Chief Complaint  Patient presents with  . Annual Exam    HPI  Patient is a 34 year old male who was last seen at [facility] in June 2019 for an annual physical exam. He was noted to have mild obesity and hyperlipidemia from that visit. He returns today for an annual physical.  USPSTF grade A and B recommendations:  Diet: He notes he has gained quite a bit of weight since his last visit, notes tries to watch and eat healthy just very recently, not adherent to a very strict diet prior, just starting to try to cook on his own, trying to eat more fruits, Exercise: notes no regular exercise, tries to walk on lunch presently  Depression:  Depression screen [facility] 2/9 12/19/2019 02/27/2018 02/27/2018 01/17/2017 12/23/2016  Decreased Interest 1 0 0 0 0  Down, Depressed, Hopeless 0 0 0 0 0  PHQ - 2 Score 1 0 0 0 0  Altered sleeping 0 0 - - -  Tired, decreased energy 0 0 - - -  Change in appetite 1 0 - - -  Feeling bad or failure about yourself  1 0 - - -  Trouble concentrating 0 0 - - -  Moving slowly or fidgety/restless 0 0 - - -  Suicidal thoughts 0 0 - - -  PHQ-9 Score 3 0 - - -  Difficult doing work/chores Not difficult at all Not difficult at all - - -  result reviewed - no major depression concern, noted sometimes just loses interest when doing things, occasionally may be feeling like having a bad day like everybody does at times, but denies any major depressive concerns.  Hypertension:  BP Readings from Last 3 Encounters:  12/19/19 124/82  02/27/18 126/84  01/17/17 116/72    Obesity: Wt Readings from Last 3 Encounters:  12/19/19 246 lb 9.6 oz (111.9 kg)  02/27/18 226 lb (102.5 kg)  01/17/17 212 lb 9.6 oz (96.4 kg)   BMI Readings from Last 3 Encounters:  12/19/19 33.44 kg/m  02/27/18 31.52 kg/m  01/17/17 29.65 kg/m     Lipids:  Lab Results   Component Value Date   CHOL 209 (H) 02/27/2018   CHOL 162 01/23/2017   CHOL 161 03/25/2015   Lab Results  Component Value Date   HDL 39 (L) 02/27/2018   HDL 45 01/23/2017   HDL 42 03/25/2015   Lab Results  Component Value Date   LDLCALC 151 (H) 02/27/2018   LDLCALC 107 (H) 01/23/2017   LDLCALC 109 (H) 03/25/2015   Lab Results  Component Value Date   TRIG 83 02/27/2018   TRIG 48 01/23/2017   TRIG 52 03/25/2015   Lab Results  Component Value Date   CHOLHDL 5.4 (H) 02/27/2018   CHOLHDL 3.6 01/23/2017   CHOLHDL 3.8 03/25/2015   No results found for: LDLDIRECT  Glucose:  Glucose  Date Value Ref Range Status  03/25/2015 83 65 - 99 mg/dL Final   Glucose, Bld  Date Value Ref Range Status  02/27/2018 90 65 - 99 mg/dL Final    Comment:    .            Fasting reference interval .   01/23/2017 89 65 - 99 mg/dL Final      Office Visit from 02/27/2018 in [facility]  AUDIT-C Score  1     Rare alcohol use Non-smoker  Single STD testing and prevention (HIV/chl/gon/syphilis): Not sexually active, Not interested Hep C: not interested presently  Skin cancer: no worrisome moles, Discussed monitoring for atypical lesions, following ABCDE's  Colorectal cancer: no FH Prostate cancer: no FH GF died recently pancreatic CA  Lung cancer: Non-smoker.  Low Dose CT Chest recommended if Age 49-80 years, 20 pack-year currently smoking OR have quit w/in 15years. Stop screening once a person has not smoked for 15 years or has a health problem that limits life.Patient does not qualify.   AAA: N/A  - The USPSTF recommends one-time screening with ultrasonography in men ages 66 to 25 years who have ever smoked ECG:   No concerning sx's, no CP, palp's, SOB, no LE swelling,   Advanced Care Planning: A voluntary discussion about advance care planning including the explanation and discussion of advance directives.  Discussed health care proxy and Living will, and the  patient was able to identify a health care proxy as parents  Patient does not have a living will at present time. If patient does have living will, I have requested they bring this to the clinic to be scanned in to their chart.  Patient Active Problem List   Diagnosis Date Noted  . Hyperlipidemia 02/28/2018  . Low HDL (under 40) 02/28/2018  . Obesity (BMI 30.0-34.9) 02/27/2018  . Preventative health care 02/27/2018  . Cervical pain (neck) 03/25/2015  . Lumbar muscle pain 03/25/2015  . Bilateral thoracic back pain 03/25/2015    Past Surgical History:  Procedure Laterality Date  . KNEE SURGERY Left 2013    Family History  Problem Relation Age of Onset  . Breast cancer Mother   . Hypertension Mother   . Colon cancer Maternal Aunt   . Anuerysm Maternal Aunt   . Diabetes Maternal Grandmother     Social History   Socioeconomic History  . Marital status: Single    Spouse name: Not on file  . Number of children: Not on file  . Years of education: Not on file  . Highest education level: Not on file  Occupational History  . Not on file  Tobacco Use  . Smoking status: Never Smoker  . Smokeless tobacco: Never Used  Substance and Sexual Activity  . Alcohol use: Yes    Alcohol/week: 0.0 standard drinks    Comment: rarely  . Drug use: No  . Sexual activity: Not Currently  Other Topics Concern  . Not on file  Social History Narrative  . Not on file   Social Determinants of Health   Financial Resource Strain: Low Risk   . Difficulty of Paying Living Expenses: Not hard at all  Food Insecurity: No Food Insecurity  . Worried About Charity fundraiser in the Last Year: Never true  . Ran Out of Food in the Last Year: Never true  Transportation Needs: No Transportation Needs  . Lack of Transportation (Medical): No  . Lack of Transportation (Non-Medical): No  Physical Activity: Insufficiently Active  . Days of Exercise per Week: 1 day  . Minutes of Exercise per Session: 30 min   Stress: No Stress Concern Present  . Feeling of Stress : Only a little  Social Connections: Slightly Isolated  . Frequency of Communication with Friends and Family: Once a week  . Frequency of Social Gatherings with Friends and Family: More than three times a week  . Attends Religious Services: More than 4 times  per year  . Active Member of Clubs or Organizations: Yes  . Attends Banker Meetings: More than 4 times per year  . Marital Status: Never married  Intimate Partner Violence: Not At Risk  . Fear of Current or Ex-Partner: No  . Emotionally Abused: No  . Physically Abused: No  . Sexually Abused: No     Current Outpatient Medications:  .  saw palmetto 80 MG capsule, Take 80 mg by mouth 2 (two) times daily., Disp: , Rfl:  No longer taking  No Known Allergies   ROS: As noted above in HPI denies any recent unintentional weight loss, has gained weight, no increased fatigue No recent fevers or other Covid concerning sx's No increase headaches, vision changes No CP, palpitations,  No increased SOB,  No increased cough,  No persistent abdominal pains or change in bowel habits, did note an occasional pain in his lower abdomen, more around the belt line in the recent past, is intermittent, and he thinks related to lack of activity.  Not problematic presently Rare BRBPR when wipes, not frequent, not presently, no dark/black stools, no chronic constipation/diarrhea No flank pains or dysuria,  No frequency or urgency No lower extremity swelling,  No increase joint aches or muscle aches, occas left knee pain (had prior surgery), ok today No numbness, tingling or weakness in the extremities Denies other specific complaints on systems review except as noted in HPI    Objective  Vitals:   12/19/19 1330  BP: 124/82  Pulse: 93  Resp: 18  Temp: (!) 97.5 F (36.4 C)  TempSrc: Temporal  SpO2: 97%  Weight: 246 lb 9.6 oz (111.9 kg)  Height: 6' (1.829 m)    Body  mass index is 33.44 kg/m.  NAD, masked, pleasant HEENT - sclera anicteric, PERRL, EOMI, conj - non-inj'ed, No sinus tenderness, TM's and canals clear, pharynx clear Neck - supple, no adenopathy, no TM, carotids 2+ and = without bruits bilat Car - RRR without m/g/r Pulm- CTA without wheeze or rales Abd - soft,obese, NT, ND, BS+, no obvious HSM, no masses, nontender in the suprapubic region or the belt line region where he has some intermittent discomfort Back - no CVA tenderness, no spinal tenderness Skin- no rash on exposed areas Ext - no LE edema, no active joints GU - no swelling in inguinal/suprapubic region, NT, no inguinal hernia on check, no pain when testing for inguinal hernia Normal descended testes bilaterally, no masses palpated, no lesions, no discharge Neuro - affect was not flat, appropriate with conversation  Grossly non-focal with good strength on testing, sensation intact to LT in distal extremities, DTR's 2+ and = patella, Romberg neg, no pronator drift, good balance on one foot, good finger to nose, gait normal with good tandem walk   Recent Results (from the past 2160 hour(s))  Novel Coronavirus, NAA ([facility])     Status: None   Collection Time: 09/30/19 11:32 AM   Specimen: Nasopharyngeal(NP) swabs in vial transport medium   NASOPHARYNGE  SCREENIN  Result Value Ref Range   SARS-CoV-2, NAA Not Detected Not Detected    Comment: This nucleic acid amplification test was developed and its performance characteristics determined by [employer]. Nucleic acid amplification tests include RT-PCR and TMA. This test has not been FDA cleared or approved. This test has been authorized by FDA under an Emergency Use Authorization (EUA). This test is only authorized for the duration of time the declaration that circumstances exist justifying the authorization of the  emergency use of in vitro diagnostic tests for detection of SARS-CoV-2 virus and/or diagnosis of COVID-19  infection under section 564(b)(1) of the Act, 21 U.S.C. 914NWG-9(F) (1), unless the authorization is terminated or revoked sooner. When diagnostic testing is negative, the possibility of a false negative result should be considered in the context of a patient's recent exposures and the presence of clinical signs and symptoms consistent with COVID-19. An individual without symptoms of COVID-19 and who is not shedding SARS-CoV-2 virus wo uld expect to have a negative (not detected) result in this assay.      PHQ2/9: Depression screen [facility] 2/9 12/19/2019 02/27/2018 02/27/2018 01/17/2017 12/23/2016  Decreased Interest 1 0 0 0 0  Down, Depressed, Hopeless 0 0 0 0 0  PHQ - 2 Score 1 0 0 0 0  Altered sleeping 0 0 - - -  Tired, decreased energy 0 0 - - -  Change in appetite 1 0 - - -  Feeling bad or failure about yourself  1 0 - - -  Trouble concentrating 0 0 - - -  Moving slowly or fidgety/restless 0 0 - - -  Suicidal thoughts 0 0 - - -  PHQ-9 Score 3 0 - - -  Difficult doing work/chores Not difficult at all Not difficult at all - - -   Result reviewed  Fall Risk: Fall Risk  12/19/2019 02/27/2018 01/17/2017 12/23/2016 04/27/2015  Falls in the past year? 0 No No No No  Number falls in past yr: 0 - - - -  Injury with Fall? 0 - - - -   Assessment & Plan  1. General Health/Health Maintenance:   -USPSTF grade A and B recommendations reviewed with patient; age-appropriate recommendations, preventive care, screening tests, etc discussed and encouraged; healthy living encouraged; see AVS for any further patient education given to patient  -Discussed importance of regular physical activity weekly, and encouraged some aerobic exercise 3-5 times a week at a minimum, will help with weight loss goals  -Discussed importance of eating healthy diet:   -Reviewed Health Maintenance -tetanus up-to-date  2. Class 1 obesity due to excess calories with serious comorbidity and body mass index (BMI) of 33.0 to  33.9 in adult Patient with significant weight gain since last visit noted.  He seems committed to eating healthier, and trying to lose some weight, and encouraged increasing physical activity today Wt Readings from Last 3 Encounters:  12/19/19 246 lb 9.6 oz (111.9 kg)  02/27/18 226 lb (102.5 kg)  01/17/17 212 lb 9.6 oz (96.4 kg)   Discussed with the patient the risk posed by an increased BMI. Discussed importance of portion control and increasing physical activity weekly trying to get at least 30 minutes 3-5 times per week. Avoid sweet beverages as he is doing, and drink water. Try to eat more servings of fruit and vegetables daily, and avoid foods high in fats, especially saturated fats Information provided in the AVS with recommendations for diet with high cholesterol  2. Mixed hyperlipidemia Recommended rechecking his lipid panel, and concerned it may be increased with the weight gain noted.  We will not check today as he is not fasting.  Agreed to give him some time to get more active and doing better with his diet to help with goals of losing some weight, and follow-up in about 3 months time to reassess.  We will check labs at that point  Follow-up sooner as needed.

## 2019-12-19 ENCOUNTER — Other Ambulatory Visit: Payer: Self-pay

## 2019-12-19 ENCOUNTER — Ambulatory Visit (INDEPENDENT_AMBULATORY_CARE_PROVIDER_SITE_OTHER): Payer: BC Managed Care – PPO | Admitting: Internal Medicine

## 2019-12-19 ENCOUNTER — Encounter: Payer: Self-pay | Admitting: Internal Medicine

## 2019-12-19 VITALS — BP 124/82 | HR 93 | Temp 97.5°F | Resp 18 | Ht 72.0 in | Wt 246.6 lb

## 2019-12-19 DIAGNOSIS — Z6833 Body mass index (BMI) 33.0-33.9, adult: Secondary | ICD-10-CM | POA: Diagnosis not present

## 2019-12-19 DIAGNOSIS — E782 Mixed hyperlipidemia: Secondary | ICD-10-CM

## 2019-12-19 DIAGNOSIS — E6609 Other obesity due to excess calories: Secondary | ICD-10-CM | POA: Insufficient documentation

## 2019-12-19 NOTE — Patient Instructions (Signed)
High Cholesterol  High cholesterol is a condition in which the blood has high levels of a white, waxy, fat-like substance (cholesterol). The human body needs small amounts of cholesterol. The liver makes all the cholesterol that the body needs. Extra (excess) cholesterol comes from the food that we eat. Cholesterol is carried from the liver by the blood through the blood vessels. If you have high cholesterol, deposits (plaques) may build up on the walls of your blood vessels (arteries). Plaques make the arteries narrower and stiffer. Cholesterol plaques increase your risk for heart attack and stroke. Work with your health care provider to keep your cholesterol levels in a healthy range. What increases the risk? This condition is more likely to develop in people who:  Eat foods that are high in animal fat (saturated fat) or cholesterol.  Are overweight.  Are not getting enough exercise.  Have a family history of high cholesterol. What are the signs or symptoms? There are no symptoms of this condition. How is this diagnosed? This condition may be diagnosed from the results of a blood test.  If you are older than age 20, your health care provider may check your cholesterol every 4-6 years.  You may be checked more often if you already have high cholesterol or other risk factors for heart disease. The blood test for cholesterol measures:  "Bad" cholesterol (LDL cholesterol). This is the main type of cholesterol that causes heart disease. The desired level for LDL is less than 100.  "Good" cholesterol (HDL cholesterol). This type helps to protect against heart disease by cleaning the arteries and carrying the LDL away. The desired level for HDL is 60 or higher.  Triglycerides. These are fats that the body can store or burn for energy. The desired number for triglycerides is lower than 150.  Total cholesterol. This is a measure of the total amount of cholesterol in your blood, including LDL  cholesterol, HDL cholesterol, and triglycerides. A healthy number is less than 200. How is this treated? This condition is treated with diet changes, lifestyle changes, and medicines. Diet changes  This may include eating more whole grains, fruits, vegetables, nuts, and fish.  This may also include cutting back on red meat and foods that have a lot of added sugar. Lifestyle changes  Changes may include getting at least 40 minutes of aerobic exercise 3 times a week. Aerobic exercises include walking, biking, and swimming. Aerobic exercise along with a healthy diet can help you maintain a healthy weight.  Changes may also include quitting smoking. Medicines  Medicines are usually given if diet and lifestyle changes have failed to reduce your cholesterol to healthy levels.  Your health care provider may prescribe a statin medicine. Statin medicines have been shown to reduce cholesterol, which can reduce the risk of heart disease. Follow these instructions at home: Eating and drinking If told by your health care provider:  Eat chicken (without skin), fish, veal, shellfish, ground turkey breast, and round or loin cuts of red meat.  Do not eat fried foods or fatty meats, such as hot dogs and salami.  Eat plenty of fruits, such as apples.  Eat plenty of vegetables, such as broccoli, potatoes, and carrots.  Eat beans, peas, and lentils.  Eat grains such as barley, rice, couscous, and bulgur wheat.  Eat pasta without cream sauces.  Use skim or nonfat milk, and eat low-fat or nonfat yogurt and cheeses.  Do not eat or drink whole milk, cream, ice cream, egg yolks,   or hard cheeses.  Do not eat stick margarine or tub margarines that contain trans fats (also called partially hydrogenated oils).  Do not eat saturated tropical oils, such as coconut oil and palm oil.  Do not eat cakes, cookies, crackers, or other baked goods that contain trans fats.  General instructions  Exercise as  directed by your health care provider. Increase your activity level with activities such as gardening, walking, and taking the stairs.  Take over-the-counter and prescription medicines only as told by your health care provider.  Do not use any products that contain nicotine or tobacco, such as cigarettes and e-cigarettes. If you need help quitting, ask your health care provider.  Keep all follow-up visits as told by your health care provider. This is important. Contact a health care provider if:  You are struggling to maintain a healthy diet or weight.  You need help to start on an exercise program.  You need help to stop smoking. Get help right away if:  You have chest pain.  You have trouble breathing. This information is not intended to replace advice given to you by your health care provider. Make sure you discuss any questions you have with your health care provider. Document Revised: 08/25/2017 Document Reviewed: 02/20/2016 Elsevier Patient Education  2020 [place] Maintenance, Male Adopting a healthy lifestyle and getting preventive care are important in promoting health and wellness. Ask your health care provider about: The right schedule for you to have regular tests and exams. Things you can do on your own to prevent diseases and keep yourself healthy. What should I know about diet, weight, and exercise? Eat a healthy diet  Eat a diet that includes plenty of vegetables, fruits, low-fat dairy products, and lean protein. Do not eat a lot of foods that are high in solid fats, added sugars, or sodium. Maintain a healthy weight Body mass index (BMI) is a measurement that can be used to identify possible weight problems. It estimates body fat based on height and weight. Your health care provider can help determine your BMI and help you achieve or maintain a healthy weight. Get regular exercise Get regular exercise. This is one of the most important things you  can do for your health. Most adults should: Exercise for at least 150 minutes each week. The exercise should increase your heart rate and make you sweat (moderate-intensity exercise). Do strengthening exercises at least twice a week. This is in addition to the moderate-intensity exercise. Spend less time sitting. Even light physical activity can be beneficial. Watch cholesterol and blood lipids Have your blood tested for lipids and cholesterol at 35 years of age, then have this test every 5 years. You may need to have your cholesterol levels checked more often if: Your lipid or cholesterol levels are high. You are older than 35 years of age. You are at high risk for heart disease. What should I know about cancer screening? Many types of cancers can be detected early and may often be prevented. Depending on your health history and family history, you may need to have cancer screening at various ages. This may include screening for: Colorectal cancer. Prostate cancer. Skin cancer. Lung cancer. What should I know about heart disease, diabetes, and high blood pressure? Blood pressure and heart disease High blood pressure causes heart disease and increases the risk of stroke. This is more likely to develop in people who have high blood pressure readings, are of African descent, or are overweight.  Talk with your health care provider about your target blood pressure readings. Have your blood pressure checked: Every 3-5 years if you are 63-52 years of age. Every year if you are 38 years old or older. If you are between the ages of 82 and 59 and are a current or former smoker, ask your health care provider if you should have a one-time screening for abdominal aortic aneurysm (AAA). Diabetes Have regular diabetes screenings. This checks your fasting blood sugar level. Have the screening done: Once every three years after age 95 if you are at a normal weight and have a low risk for diabetes. More  often and at a younger age if you are overweight or have a high risk for diabetes. What should I know about preventing infection? Hepatitis B If you have a higher risk for hepatitis B, you should be screened for this virus. Talk with your health care provider to find out if you are at risk for hepatitis B infection. Hepatitis C Blood testing is recommended for: Everyone born from [birth year] through 1966. Anyone with known risk factors for hepatitis C. Sexually transmitted infections (STIs) You should be screened each year for STIs, including gonorrhea and chlamydia, if: You are sexually active and are younger than 35 years of age. You are older than 35 years of age and your health care provider tells you that you are at risk for this type of infection. Your sexual activity has changed since you were last screened, and you are at increased risk for chlamydia or gonorrhea. Ask your health care provider if you are at risk. Ask your health care provider about whether you are at high risk for HIV. Your health care provider may recommend a prescription medicine to help prevent HIV infection. If you choose to take medicine to prevent HIV, you should first get tested for HIV. You should then be tested every 3 months for as long as you are taking the medicine. Follow these instructions at home: Lifestyle Do not use any products that contain nicotine or tobacco, such as cigarettes, e-cigarettes, and chewing tobacco. If you need help quitting, ask your health care provider. Do not use street drugs. Do not share needles. Ask your health care provider for help if you need support or information about quitting drugs. Alcohol use Do not drink alcohol if your health care provider tells you not to drink. If you drink alcohol: Limit how much you have to 0-2 drinks a day. Be aware of how much alcohol is in your drink. In the [place] one drink equals one 12 oz bottle of beer (355 mL), one 5 oz glass of wine (148 mL), or  one 1 oz glass of hard liquor (44 mL). General instructions Schedule regular health, dental, and eye exams. Stay current with your vaccines. Tell your health care provider if: You often feel depressed. You have ever been abused or do not feel safe at home. Summary Adopting a healthy lifestyle and getting preventive care are important in promoting health and wellness. Follow your health care provider's instructions about healthy diet, exercising, and getting tested or screened for diseases. Follow your health care provider's instructions on monitoring your cholesterol and blood pressure. This information is not intended to replace advice given to you by your health care provider. Make sure you discuss any questions you have with your health care provider. Document Revised: 08/15/2018 Document Reviewed: 08/15/2018 Elsevier Patient Education  2020 [employer].

## 2020-03-18 NOTE — Progress Notes (Signed)
Patient ID: David Foster, male    DOB: 1985-12-15, 34 y.o.   MRN: [id number]  PCP: [name], MD  Chief Complaint  Patient presents with  . Weight Check    Subjective:   David Foster is a 34 y.o. male, presents to clinic with CC of the following:  Chief Complaint  Patient presents with  . Weight Check    HPI:  Patient is a 34 year old male who I saw for his annual physical on 12/19/2019 He follows up today.  He notes he has been feeling well. He has been very successful losing weight, stopped eating at his mom's, and has been doing a keto diet. He states he is eating well at his meals, and with losing the weight, it is easier to tie his shoes, his heartburn has drastically reduced. Still has some intermittent low back pains, although not marked, and still no pains go down the legs, still no urinary symptoms of concern. He notes his goal is for a weight under 200, and will try to just go to more maintenance after that.   Class 1 obesity due to excess calories with serious comorbidity and body mass index (BMI) of 33.0 to 33.9 in adult Patient had significant weight gain noted at that physical  Since, he has had significant weight loss noted Wt Readings from Last 3 Encounters:  03/19/20 204 lb 1.6 oz (92.6 kg)  12/19/19 246 lb 9.6 oz (111.9 kg)  02/27/18 226 lb (102.5 kg)     2. Mixed hyperlipidemia Medication regimen-none Lab Results  Component Value Date   CHOL 209 (H) 02/27/2018   HDL 39 (L) 02/27/2018   LDLCALC 151 (H) 02/27/2018   TRIG 83 02/27/2018   CHOLHDL 5.4 (H) 02/27/2018   Waited to recheck his lipid panel as he wanted time to lose weight after our last visit. He was okay with checking it today.    Patient Active Problem List   Diagnosis Date Noted  . Class 1 obesity due to excess calories with serious comorbidity and body mass index (BMI) of 33.0 to 33.9 in adult 12/19/2019  . Hyperlipidemia 02/28/2018  . Low HDL (under 40)  02/28/2018  . Preventative health care 02/27/2018  . Cervical pain (neck) 03/25/2015  . Lumbar muscle pain 03/25/2015  . Bilateral thoracic back pain 03/25/2015     No current outpatient medications on file.   No Known Allergies   Past Surgical History:  Procedure Laterality Date  . KNEE SURGERY Left 2013     Family History  Problem Relation Age of Onset  . Breast cancer Mother   . Hypertension Mother   . Colon cancer Maternal Aunt   . Anuerysm Maternal Aunt   . Diabetes Maternal Grandmother   . Dementia Maternal Grandmother   . Pancreatic cancer Maternal Grandfather   . Urinary tract infection Paternal Grandfather      Social History   Tobacco Use  . Smoking status: Never Smoker  . Smokeless tobacco: Never Used  Substance Use Topics  . Alcohol use: Yes    Alcohol/week: 0.0 standard drinks    Comment: rarely    With staff assistance, above reviewed with the patient today.  ROS: As per HPI, otherwise no specific complaints on a limited and focused system review   No results found for this or any previous visit (from the past 72 hour(s)).   PHQ2/9: Depression screen [facility] 2/9 03/19/2020 12/19/2019 02/27/2018 02/27/2018 01/17/2017  Decreased Interest 0 1  0 0 0  Down, Depressed, Hopeless 0 0 0 0 0  PHQ - 2 Score 0 1 0 0 0  Altered sleeping 0 0 0 - -  Tired, decreased energy 0 0 0 - -  Change in appetite 0 1 0 - -  Feeling bad or failure about yourself  0 1 0 - -  Trouble concentrating 0 0 0 - -  Moving slowly or fidgety/restless 0 0 0 - -  Suicidal thoughts 0 0 0 - -  PHQ-9 Score 0 3 0 - -  Difficult doing work/chores Not difficult at all Not difficult at all Not difficult at all - -   PHQ-2/9 Result is neg  Fall Risk: Fall Risk  03/19/2020 12/19/2019 02/27/2018 01/17/2017 12/23/2016  Falls in the past year? 0 0 No No No  Number falls in past yr: 0 0 - - -  Injury with Fall? 0 0 - - -      Objective:   Vitals:   03/19/20 1329  BP: 132/84  Pulse: 77    Resp: 16  Temp: 98.3 F (36.8 C)  TempSrc: Temporal  SpO2: 99%  Weight: 204 lb 1.6 oz (92.6 kg)  Height: 6' (1.829 m)    Body mass index is 27.68 kg/m.  Physical Exam    NAD, masked, pleasant HEENT - sclera anicteric, PERRL, EOMI, conj - non-inj'ed, pharynx clear Neck - supple, no adenopathy, no TM,  Car - RRR without m/g/r Pulm- CTA without wheeze or rales Abd - soft,obese, NT, ND, BS+, no obvious HSM, no masses, Back - no CVA tenderness, straight leg raise negative with tight hamstrings noted Ext - no LE edema,  Neuro - affect was not flat, appropriate with conversation             Grossly non-focal   Results for orders placed or performed in visit on 09/30/19  Novel Coronavirus, NAA ([facility])   Specimen: Nasopharyngeal(NP) swabs in vial transport medium   NASOPHARYNGE  SCREENIN  Result Value Ref Range   SARS-CoV-2, NAA Not Detected Not Detected       Assessment & Plan:  1. Mixed hyperlipidemia Last labs reviewed, abnormal lipid panel noted. We will recheck again today. He was given time to lose weight and change his diet and he has been very successful with doing that. Await labs presently. - Lipid panel - COMPLETE METABOLIC PANEL WITH GFR  2. Overweight (BMI 25.0-29.9) Weight has significantly lessened since last visit, with the importance now of a healthy weight maintenance noted as he decides to transition to that as he gets less than 200 pounds. Emphasized the importance of staying well-hydrated with the keto diet, especially in the summer months, and did discuss some concerns with becoming ketotic on this type of diet, especially if continuing long-term. He plans to lessen the strictness of his diet very soon, and proceed to a a more healthy weight maintenance approach. - Lipid panel - COMPLETE METABOLIC PANEL WITH GFR  3. Chronic bilateral low back pain without sciatica Do think the weight loss will continue to help with his low back pain, although still  having some symptoms. Discussed today the importance of regular physical activity, including aerobic exercise and regular stretching exercises. Also discussed the importance of posture and postural stabilization exercises as he was slumped a little when sitting on the exam table today. If symptoms increasing, changing, he needs to follow-up again.     We will schedule follow-up in a years time, sooner as  needed, and await the lab results presently.    [name], MD 03/19/20 1:31 PM

## 2020-03-19 ENCOUNTER — Ambulatory Visit: Payer: BC Managed Care – PPO | Admitting: Internal Medicine

## 2020-03-19 ENCOUNTER — Encounter: Payer: Self-pay | Admitting: Internal Medicine

## 2020-03-19 ENCOUNTER — Other Ambulatory Visit: Payer: Self-pay

## 2020-03-19 VITALS — BP 132/84 | HR 77 | Temp 98.3°F | Resp 16 | Ht 72.0 in | Wt 204.1 lb

## 2020-03-19 DIAGNOSIS — G8929 Other chronic pain: Secondary | ICD-10-CM

## 2020-03-19 DIAGNOSIS — E663 Overweight: Secondary | ICD-10-CM

## 2020-03-19 DIAGNOSIS — M545 Low back pain, unspecified: Secondary | ICD-10-CM

## 2020-03-19 DIAGNOSIS — E782 Mixed hyperlipidemia: Secondary | ICD-10-CM | POA: Diagnosis not present

## 2020-03-20 LAB — LIPID PANEL
Cholesterol: 192 mg/dL (ref <200)
HDL: 41 mg/dL (ref >=40)
LDL Cholesterol (Calc): 136 mg/dL (calc) — ABNORMAL HIGH
Non-HDL Cholesterol (Calc): 151 mg/dL (calc) — ABNORMAL HIGH (ref <130)
Total CHOL/HDL Ratio: 4.7 (calc) (ref <5.0)
Triglycerides: 56 mg/dL (ref <150)

## 2020-03-20 LAB — COMPLETE METABOLIC PANEL WITH GFR
AG Ratio: 2.1 (calc) (ref 1.0–2.5)
ALT: 19 U/L (ref 9–46)
AST: 16 U/L (ref 10–40)
Albumin: 4.5 g/dL (ref 3.6–5.1)
Alkaline phosphatase (APISO): 52 U/L (ref 36–130)
BUN: 13 mg/dL (ref 7–25)
CO2: 27 mmol/L (ref 20–32)
Calcium: 9.6 mg/dL (ref 8.6–10.3)
Chloride: 101 mmol/L (ref 98–110)
Creat: 0.91 mg/dL (ref 0.60–1.35)
GFR, Est African American: 127 mL/min/{1.73_m2} (ref >=60)
GFR, Est Non African American: 110 mL/min/{1.73_m2} (ref >=60)
Globulin: 2.1 g/dL (calc) (ref 1.9–3.7)
Glucose, Bld: 74 mg/dL (ref 65–99)
Potassium: 3.9 mmol/L (ref 3.5–5.3)
Sodium: 139 mmol/L (ref 135–146)
Total Bilirubin: 0.7 mg/dL (ref 0.2–1.2)
Total Protein: 6.6 g/dL (ref 6.1–8.1)

## 2020-09-23 ENCOUNTER — Encounter: Payer: Self-pay | Admitting: Internal Medicine

## 2020-09-23 ENCOUNTER — Telehealth (INDEPENDENT_AMBULATORY_CARE_PROVIDER_SITE_OTHER): Payer: BC Managed Care – PPO | Admitting: Internal Medicine

## 2020-09-23 DIAGNOSIS — U071 COVID-19: Secondary | ICD-10-CM | POA: Diagnosis not present

## 2020-09-23 NOTE — Progress Notes (Signed)
Name: David Foster   MRN: [id number]    DOB: 1985/12/10   Date:09/23/2020       Progress Note  Subjective  Chief Complaint  Chief Complaint  Patient presents with  . Covid Exposure    Has a horrible sore throat and fever that comes and goes. Symptoms began on Saturday.     I connected with  Rossie Muskrat on 09/23/20 at 10:00 AM EST by telephone and verified that I am speaking with the correct person using two identifiers.  I discussed the limitations, risks, security and privacy concerns of performing an evaluation and management service by telephone and the availability of in person appointments. The patient expressed understanding and agreed to proceed. Staff also discussed with the patient that there may be a patient responsible charge related to this service. Patient Location: Home Provider Location: [facility] Additional Individuals present: none  HPI Patient is a 35 year old male Had COVID-19 and influenza testing done on 09/21/2020 due to contact with and suspected exposure to COVID, not have results back yet, did rapid test this am and was + Covid Follows up today with a phone visit.  COVID immunization status - not vaccinated Symptoms began on Saturday exposure history - Dad exposed at his work and ill, mom got ill and tested negative, was around them and then mom got worse and was + then (Monday)  + very minimal cough, no production No marked SOB + fever, T - not taken,  feeling feverish intermittently with chills + sore throat -comes and goes.  + mild congestion presently, lessened since Tues, min PND at the beginning, more now and mucus is clear No loss of smell, loss of taste No N/V + muscle aches - lots, + fatigue + loose stools/diarrhea on Sunday, better now No CP,  passing out episode, had some tingling in extremities Tuesday evening with some weakness and that has not recurred Has tried tylenol, ibuprofen, ASA, cough drops  Tobacco-never smoker Comorbid  conditions reviewed + obesity  Isolated since sx'ic   Work [employer]     Patient Active Problem List   Diagnosis Date Noted  . Class 1 obesity due to excess calories with serious comorbidity and body mass index (BMI) of 33.0 to 33.9 in adult 12/19/2019  . Hyperlipidemia 02/28/2018  . Low HDL (under 40) 02/28/2018  . Preventative health care 02/27/2018  . Cervical pain (neck) 03/25/2015  . Chronic bilateral low back pain without sciatica 03/25/2015  . Bilateral thoracic back pain 03/25/2015    Past Surgical History:  Procedure Laterality Date  . KNEE SURGERY Left 2013    Family History  Problem Relation Age of Onset  . Breast cancer Mother   . Hypertension Mother   . Colon cancer Maternal Aunt   . Anuerysm Maternal Aunt   . Diabetes Maternal Grandmother   . Dementia Maternal Grandmother   . Pancreatic cancer Maternal Grandfather   . Urinary tract infection Paternal Grandfather     Social History   Tobacco Use  . Smoking status: Never Smoker  . Smokeless tobacco: Never Used  Substance Use Topics  . Alcohol use: Yes    Alcohol/week: 0.0 standard drinks    Comment: rarely    No current outpatient medications on file.  No Known Allergies  With staff assistance, above reviewed with the patient today.  ROS: As per HPI, otherwise no specific complaints on a limited and focused system review   Objective  Virtual encounter, vitals not obtained.  There is no height or weight on file to calculate BMI.  Physical Exam   Appears in NAD via conversation, pleasant, no coughing episodes during our conversation Pulmonary/Chest: No obvious respiratory distress. Speaking in complete sentences Neurological: Pt is alert, Speech is normal Psychiatric: Patient has a normal mood and affect, behavior is normal. Judgment and thought content normal.   No results found for this or any previous visit (from the past 72 hour(s)).  PHQ2/9: Depression screen [facility]  2/9 09/23/2020 03/19/2020 12/19/2019 02/27/2018 02/27/2018  Decreased Interest 0 0 1 0 0  Down, Depressed, Hopeless 0 0 0 0 0  PHQ - 2 Score 0 0 1 0 0  Altered sleeping - 0 0 0 -  Tired, decreased energy - 0 0 0 -  Change in appetite - 0 1 0 -  Feeling bad or failure about yourself  - 0 1 0 -  Trouble concentrating - 0 0 0 -  Moving slowly or fidgety/restless - 0 0 0 -  Suicidal thoughts - 0 0 0 -  PHQ-9 Score - 0 3 0 -  Difficult doing work/chores - Not difficult at all Not difficult at all Not difficult at all -   PHQ-2/9 Result reviewed  Fall Risk: Fall Risk  09/23/2020 03/19/2020 12/19/2019 02/27/2018 01/17/2017  Falls in the past year? 0 0 0 No No  Number falls in past yr: 0 0 0 - -  Injury with Fall? 0 0 0 - -     Assessment & Plan 1. COVID  Patient has symptoms concerning for COVID, had a positive exposure, and is awaiting his test he had on Monday, although he did a rapid test here today and was very positive.  Do feel he has COVID.  Do feel need to manage as such. Noted treatments that are available including antibody infusions and oral pills now, although they are very limited in availability, and given his age and lack of significant comorbidities, not feel will be able to obtain through the [facility] health system. Recommended continuing to treat symptomatically, with lozenges or throat sprays as needed, rest and increased fluids, Tylenol products as needed, not to take aspirin products to help presently, and can use a Mucinex or Robitussin type product if having more cough. Emphasized if symptoms increasing or more problematic, the importance of following up, and if has acute increase of symptoms including increased shortness of breath, chest pains, passing out episodes, higher fevers, he needs to be seen more emergently in an ER setting.  He was understanding of that.  Discussed returning to work, and can return when symptoms significantly improved/almost resolved, with a minimum of 5  days since symptom onset.  Realistically, do feel he may be able to return early next week, and he is to contact us at that point if he needs a note to return to work. Noted when he does return, he needs to wear a mask at all times for at least 7 days. Needs to stay isolated presently until symptoms are significantly improved.  Menses which he is doing)  He was understanding of the recommendations at this time, and await his response.  I discussed the assessment and treatment plan with the patient. The patient was provided an opportunity to ask questions and all were answered. The patient agreed with the plan and demonstrated an understanding of the instructions.  Red flags and when to present for emergency care or RTC including fevers, chest pain, shortness of breath, new/worsening/un-resolving symptoms reviewed with  patient at time of visit.   The patient was advised to call back or seek an in-person evaluation if the symptoms worsen or if the condition fails to improve as anticipated.  I provided 15 minutes of non-face-to-face time during this encounter that included discussing at length patient's sx/history, pertinent pmhx, medications, treatment and follow up plan. This time also included the necessary documentation, orders, and chart review.  [name], MD

## 2020-09-28 ENCOUNTER — Telehealth: Payer: Self-pay

## 2020-09-28 NOTE — Telephone Encounter (Signed)
Copied from CRM [id number]. Topic: General - Other >> Sep 28, 2020 10:48 AM Tamela Oddi wrote: Reason for CRM: Patient would like  to know how he can get a return to work letter after testing positive for covid.  He stated he needs it for his employer.  Please call to discuss at [phone]

## 2020-09-28 NOTE — Telephone Encounter (Signed)
Letter sent via Mychart

## 2020-09-28 NOTE — Telephone Encounter (Signed)
I called patient he said his symptoms started on 01/17 he tested on 01/21. He has been fever free for more than 24 hours and symptoms have resolved. Please ok return to work note. I told him it would be sent via mychart

## 2021-03-23 ENCOUNTER — Encounter: Payer: BC Managed Care – PPO | Admitting: Internal Medicine

## 2022-06-21 ENCOUNTER — Encounter: Payer: Self-pay | Admitting: Internal Medicine

## 2022-06-21 ENCOUNTER — Ambulatory Visit (INDEPENDENT_AMBULATORY_CARE_PROVIDER_SITE_OTHER): Payer: 59 | Admitting: Internal Medicine

## 2022-06-21 VITALS — BP 126/80 | HR 79 | Temp 98.4°F | Resp 16 | Ht 72.0 in | Wt 183.6 lb

## 2022-06-21 DIAGNOSIS — Z13220 Encounter for screening for lipoid disorders: Secondary | ICD-10-CM | POA: Diagnosis not present

## 2022-06-21 DIAGNOSIS — Z1159 Encounter for screening for other viral diseases: Secondary | ICD-10-CM

## 2022-06-21 DIAGNOSIS — Z0000 Encounter for general adult medical examination without abnormal findings: Secondary | ICD-10-CM

## 2022-06-21 DIAGNOSIS — R339 Retention of urine, unspecified: Secondary | ICD-10-CM | POA: Diagnosis not present

## 2022-06-21 DIAGNOSIS — Z114 Encounter for screening for human immunodeficiency virus [HIV]: Secondary | ICD-10-CM | POA: Diagnosis not present

## 2022-06-21 LAB — CBC WITH DIFFERENTIAL/PLATELET
Basophils Relative: 0.5 %
Eosinophils Absolute: 70 cells/uL (ref 15–500)
HCT: 44.7 % (ref 38.5–50.0)
Hemoglobin: 15.6 g/dL (ref 13.2–17.1)
MCH: 30.7 pg (ref 27.0–33.0)
MCHC: 34.9 g/dL (ref 32.0–36.0)
MCV: 88 fL (ref 80.0–100.0)
Platelets: 262 10*3/uL (ref 140–400)

## 2022-06-21 LAB — POCT URINALYSIS DIPSTICK
Bilirubin, UA: NEGATIVE
Blood, UA: NEGATIVE
Glucose, UA: NEGATIVE
Ketones, UA: NEGATIVE
Leukocytes, UA: NEGATIVE
Nitrite, UA: NEGATIVE
Odor: NORMAL
Protein, UA: NEGATIVE
Spec Grav, UA: 1.02 (ref 1.010–1.025)
Urobilinogen, UA: 0.2 E.U./dL
pH, UA: 5.5 (ref 5.0–8.0)

## 2022-06-21 NOTE — Progress Notes (Signed)
Name: David Foster   MRN: [id number]    DOB: 1986/08/06   Date:06/21/2022       Progress Note  Subjective  Chief Complaint  Chief Complaint  Patient presents with   Annual Exam    HPI  Patient presents for annual CPE.  IPSS Questionnaire (AUA-7): Over the past month.   1)  How often have you had a sensation of not emptying your bladder completely after you finish urinating?  3 - About half the time  2)  How often have you had to urinate again less than two hours after you finished urinating? 5 - Almost always  3)  How often have you found you stopped and started again several times when you urinated?  5 - Almost always  4) How difficult have you found it to postpone urination?  2 - Less than half the time  5) How often have you had a weak urinary stream?  2 - Less than half the time  6) How often have you had to push or strain to begin urination?  1 - Less than 1 time in 5  7) How many times did you most typically get up to urinate from the time you went to bed until the time you got up in the morning?  1 - 1 time  Total score:  0-7 mildly symptomatic   8-19 moderately symptomatic   20-35 severely symptomatic     Diet: well rounded, try to avoid sugars and junk food  Exercise: no, very active at work  Last Dental Exam: UTD, 1 week ago  Last Eye Exam: No  Depression: phq 9 is negative    06/21/2022    8:07 AM 09/23/2020   10:03 AM 03/19/2020    1:30 PM 12/19/2019    1:32 PM 02/27/2018   11:29 AM  Depression screen PHQ 2/9  Decreased Interest 0 0 0 1 0  Down, Depressed, Hopeless 0 0 0 0 0  PHQ - 2 Score 0 0 0 1 0  Altered sleeping 0  0 0 0  Tired, decreased energy 0  0 0 0  Change in appetite 0  0 1 0  Feeling bad or failure about yourself  0  0 1 0  Trouble concentrating 0  0 0 0  Moving slowly or fidgety/restless 0  0 0 0  Suicidal thoughts 0  0 0 0  PHQ-9 Score 0  0 3 0  Difficult doing work/chores Not difficult at all  Not difficult at all Not difficult at all  Not difficult at all    Hypertension:  BP Readings from Last 3 Encounters:  06/21/22 126/80  03/19/20 132/84  12/19/19 124/82    Obesity: Wt Readings from Last 3 Encounters:  06/21/22 183 lb 9.6 oz (83.3 kg)  03/19/20 204 lb 1.6 oz (92.6 kg)  12/19/19 246 lb 9.6 oz (111.9 kg)   BMI Readings from Last 3 Encounters:  06/21/22 24.90 kg/m  03/19/20 27.68 kg/m  12/19/19 33.44 kg/m     Lipids:  Lab Results  Component Value Date   CHOL 192 03/19/2020   CHOL 209 (H) 02/27/2018   CHOL 162 01/23/2017   Lab Results  Component Value Date   HDL 41 03/19/2020   HDL 39 (L) 02/27/2018   HDL 45 01/23/2017   Lab Results  Component Value Date   LDLCALC 136 (H) 03/19/2020   LDLCALC 151 (H) 02/27/2018   LDLCALC 107 (H) 01/23/2017   Lab Results  Component  Value Date   TRIG 56 03/19/2020   TRIG 83 02/27/2018   TRIG 48 01/23/2017   Lab Results  Component Value Date   CHOLHDL 4.7 03/19/2020   CHOLHDL 5.4 (H) 02/27/2018   CHOLHDL 3.6 01/23/2017   No results found for: "LDLDIRECT" Glucose:  Glucose, Bld  Date Value Ref Range Status  03/19/2020 74 65 - 99 mg/dL Final    Comment:    .            Fasting reference interval .   02/27/2018 90 65 - 99 mg/dL Final    Comment:    .            Fasting reference interval .   01/23/2017 89 65 - 99 mg/dL Final    Flowsheet Row Office Visit from 02/27/2018 in [facility]  AUDIT-C Score 1      Single STD testing and prevention (HIV/chl/gon/syphilis):  no Hep C Screening: Due Skin cancer: Discussed monitoring for atypical lesions Colorectal cancer: N/a Prostate cancer:  not applicable No results found for: "PSA"  Lung cancer:  Low Dose CT Chest recommended if Age 54-80 years, 30 pack-year currently smoking OR have quit w/in 15years. Patient  not applicable a candidate for screening   AAA: The USPSTF recommends one-time screening with ultrasonography in men ages 31 to 60 years who have ever smoked.  Patient   no, a candidate for screening   Vaccines:   HPV: N/a Tdap: 2020  Shingrix: n/a Pneumonia: n/a Flu: Politely declines COVID-19: no vaccine history   Advanced Care Planning: A voluntary discussion about advance care planning including the explanation and discussion of advance directives.  Discussed health care proxy and Living will, and the patient was able to identify a health care proxy as parents Sherrell Wasem and [employer].  Patient does not have a living will and power of attorney of health care.  Patient Active Problem List   Diagnosis Date Noted   COVID 09/23/2020   Class 1 obesity due to excess calories with serious comorbidity and body mass index (BMI) of 33.0 to 33.9 in adult 12/19/2019   Hyperlipidemia 02/28/2018   Low HDL (under 40) 02/28/2018   Preventative health care 02/27/2018   Cervical pain (neck) 03/25/2015   Chronic bilateral low back pain without sciatica 03/25/2015   Bilateral thoracic back pain 03/25/2015    Past Surgical History:  Procedure Laterality Date   KNEE SURGERY Left 2013    Family History  Problem Relation Age of Onset   Breast cancer Mother    Hypertension Mother    Colon cancer Maternal Aunt    Anuerysm Maternal Aunt    Diabetes Maternal Grandmother    Dementia Maternal Grandmother    Pancreatic cancer Maternal Grandfather    Urinary tract infection Paternal Grandfather     Social History   Socioeconomic History   Marital status: Single    Spouse name: Not on file   Number of children: Not on file   Years of education: Not on file   Highest education level: Not on file  Occupational History   Not on file  Tobacco Use   Smoking status: Never   Smokeless tobacco: Never  Vaping Use   Vaping Use: Never used  Substance and Sexual Activity   Alcohol use: Yes    Alcohol/week: 0.0 standard drinks of alcohol    Comment: rarely   Drug use: No   Sexual activity: Not Currently  Other Topics Concern  Not on file  Social  History Narrative   Not on file   Social Determinants of Health   Financial Resource Strain: Low Risk  (12/19/2019)   Overall Financial Resource Strain (CARDIA)    Difficulty of Paying Living Expenses: Not hard at all  Food Insecurity: No Food Insecurity (06/21/2022)   Hunger Vital Sign    Worried About Running Out of Food in the Last Year: Never true    Ran Out of Food in the Last Year: Never true  Transportation Needs: No Transportation Needs (12/19/2019)   PRAPARE - Hydrologist (Medical): No    Lack of Transportation (Non-Medical): No  Physical Activity: Insufficiently Active (12/19/2019)   Exercise Vital Sign    Days of Exercise per Week: 1 day    Minutes of Exercise per Session: 30 min  Stress: No Stress Concern Present (12/19/2019)   [place]    Feeling of Stress : Only a little  Social Connections: Moderately Integrated (12/19/2019)   Social Connection and Isolation Panel [NHANES]    Frequency of Communication with Friends and Family: Once a week    Frequency of Social Gatherings with Friends and Family: More than three times a week    Attends Religious Services: More than 4 times per year    Active Member of [employer] or Organizations: Yes    Attends Archivist Meetings: More than 4 times per year    Marital Status: Never married  Intimate Partner Violence: Not At Risk (12/19/2019)   Humiliation, Afraid, Rape, and Kick questionnaire    Fear of Current or Ex-Partner: No    Emotionally Abused: No    Physically Abused: No    Sexually Abused: No    No current outpatient medications on file.  No Known Allergies   Review of Systems  All other systems reviewed and are negative.  Objective  Vitals:   06/21/22 0806  BP: 126/80  Pulse: 79  Resp: 16  Temp: 98.4 F (36.9 C)  SpO2: 99%  Weight: 183 lb 9.6 oz (83.3 kg)  Height: 6' (1.829 m)    Body mass index is 24.9  kg/m.  Physical Exam Constitutional:      Appearance: Normal appearance.  HENT:     Head: Normocephalic and atraumatic.     Mouth/Throat:     Mouth: Mucous membranes are moist.     Pharynx: Oropharynx is clear.  Eyes:     Extraocular Movements: Extraocular movements intact.     Conjunctiva/sclera: Conjunctivae normal.     Pupils: Pupils are equal, round, and reactive to light.  Cardiovascular:     Rate and Rhythm: Normal rate and regular rhythm.  Pulmonary:     Effort: Pulmonary effort is normal.     Breath sounds: Normal breath sounds.  Musculoskeletal:     Cervical back: No tenderness.     Right lower leg: No edema.     Left lower leg: No edema.  Lymphadenopathy:     Cervical: No cervical adenopathy.  Skin:    General: Skin is warm and dry.  Neurological:     General: No focal deficit present.     Mental Status: He is alert. Mental status is at baseline.  Psychiatric:        Mood and Affect: Mood normal.        Behavior: Behavior normal.     No results found for this or any previous visit (from  the past 2160 hour(s)).   Fall Risk:    06/21/2022    8:07 AM 09/23/2020   10:02 AM 03/19/2020    1:30 PM 12/19/2019    1:33 PM 02/27/2018   11:28 AM  Fall Risk   Falls in the past year? 0 0 0 0 No  Number falls in past yr: 0 0 0 0   Injury with Fall? 0 0 0 0      Functional Status Survey: Is the patient deaf or have difficulty hearing?: No Does the patient have difficulty seeing, even when wearing glasses/contacts?: No Does the patient have difficulty concentrating, remembering, or making decisions?: No Does the patient have difficulty walking or climbing stairs?: No Does the patient have difficulty dressing or bathing?: No Does the patient have difficulty doing errands alone such as visiting a doctor's office or shopping?: No    Assessment & Plan  1. Annual physical exam/Lipid screening/Need for hepatitis C screening test/Encounter for screening for HIV:  Annual labs due today.   - CBC w/Diff/Platelet - COMPLETE METABOLIC PANEL WITH GFR - Lipid Profile - HIV antibody (with reflex) - Hepatitis C Antibody  2. Incomplete emptying of bladder: Urine dip in the office normal, screening for BPH symptoms moderate, however patient states he can manage symptoms and does not want medication. Continue to monitor.   - POCT Urinalysis Dipstick  -Prostate cancer screening and PSA options (with potential risks and benefits of testing vs not testing) were discussed along with recent recs/guidelines. -USPSTF grade A and B recommendations reviewed with patient; age-appropriate recommendations, preventive care, screening tests, etc discussed and encouraged; healthy living encouraged; see AVS for patient education given to patient -Discussed importance of 150 minutes of physical activity weekly, eat two servings of fish weekly, eat one serving of tree nuts ( cashews, pistachios, pecans, almonds.Marland Kitchen) every other day, eat 6 servings of fruit/vegetables daily and drink plenty of water and avoid sweet beverages.  -Reviewed Health Maintenance:

## 2022-06-22 LAB — LIPID PANEL
Cholesterol: 198 mg/dL (ref <200)
HDL: 57 mg/dL (ref >=40)
LDL Cholesterol (Calc): 123 mg/dL (calc) — ABNORMAL HIGH
Non-HDL Cholesterol (Calc): 141 mg/dL (calc) — ABNORMAL HIGH (ref <130)
Total CHOL/HDL Ratio: 3.5 (calc) (ref <5.0)
Triglycerides: 85 mg/dL (ref <150)

## 2022-06-22 LAB — COMPLETE METABOLIC PANEL WITH GFR
AG Ratio: 2.1 (calc) (ref 1.0–2.5)
ALT: 16 U/L (ref 9–46)
AST: 14 U/L (ref 10–40)
Albumin: 4.6 g/dL (ref 3.6–5.1)
Alkaline phosphatase (APISO): 36 U/L (ref 36–130)
BUN: 16 mg/dL (ref 7–25)
CO2: 33 mmol/L — ABNORMAL HIGH (ref 20–32)
Calcium: 9.9 mg/dL (ref 8.6–10.3)
Chloride: 103 mmol/L (ref 98–110)
Creat: 0.91 mg/dL (ref 0.60–1.26)
Globulin: 2.2 g/dL (calc) (ref 1.9–3.7)
Glucose, Bld: 88 mg/dL (ref 65–99)
Potassium: 4.6 mmol/L (ref 3.5–5.3)
Sodium: 141 mmol/L (ref 135–146)
Total Bilirubin: 0.6 mg/dL (ref 0.2–1.2)
Total Protein: 6.8 g/dL (ref 6.1–8.1)
eGFR: 112 mL/min/{1.73_m2} (ref >=60)

## 2022-06-22 LAB — HEPATITIS C ANTIBODY: Hepatitis C Ab: NONREACTIVE

## 2022-06-22 LAB — CBC WITH DIFFERENTIAL/PLATELET
Absolute Monocytes: 370 cells/uL (ref 200–950)
Basophils Absolute: 22 cells/uL (ref 0–200)
Eosinophils Relative: 1.6 %
Lymphs Abs: 1940 cells/uL (ref 850–3900)
MPV: 9.3 fL (ref 7.5–12.5)
Monocytes Relative: 8.4 %
Neutro Abs: 1998 cells/uL (ref 1500–7800)
Neutrophils Relative %: 45.4 %
RBC: 5.08 10*6/uL (ref 4.20–5.80)
RDW: 12.6 % (ref 11.0–15.0)
Total Lymphocyte: 44.1 %
WBC: 4.4 10*3/uL (ref 3.8–10.8)

## 2022-06-22 LAB — HIV ANTIBODY (ROUTINE TESTING W REFLEX): HIV 1&2 Ab, 4th Generation: NONREACTIVE

## 2023-06-23 ENCOUNTER — Encounter: Payer: 59 | Admitting: Internal Medicine

## 2023-07-13 ENCOUNTER — Ambulatory Visit (INDEPENDENT_AMBULATORY_CARE_PROVIDER_SITE_OTHER): Payer: 59 | Admitting: Physician Assistant

## 2023-07-13 ENCOUNTER — Encounter: Payer: Self-pay | Admitting: Physician Assistant

## 2023-07-13 VITALS — BP 116/82 | HR 96 | Temp 98.9°F | Resp 18 | Ht 72.0 in | Wt 217.7 lb

## 2023-07-13 DIAGNOSIS — Z7189 Other specified counseling: Secondary | ICD-10-CM | POA: Insufficient documentation

## 2023-07-13 DIAGNOSIS — Z0000 Encounter for general adult medical examination without abnormal findings: Secondary | ICD-10-CM

## 2023-07-13 NOTE — Assessment & Plan Note (Signed)
A voluntary discussion about advance care planning including the explanation and discussion of advance directives was extensively discussed  with the patient for 5 minutes with patient and myself present.  Explanation about the health care proxy and Living will was reviewed and packet with forms with explanation of how to fill them out was given.  During this discussion, the patient was able to identify a health care proxy as no one currently and plans to fill out the paperwork required.  Patient was offered a separate Advance Care Planning visit for further assistance with forms.

## 2023-07-13 NOTE — Progress Notes (Signed)
Annual Physical Exam   Name: David Foster   MRN: [id number]    DOB: 29-Apr-1986   Date:07/13/2023  Today's Provider: Jacquelin Hawking, MHS, PA-C Introduced myself to the patient as a PA-C and provided education on APPs in clinical practice.         Subjective  Chief Complaint  Chief Complaint  Patient presents with   Annual Exam    HPI  Patient presents for annual CPE .   Diet: He is trying to follow Keto diet but rather loosely following this right now  Exercise: He is not engaged in regular exercise but has an active job Sleep: "it is fine, no complaints" getting about 7-8 hours per night, usually feels well rested but somedays still feels groggy Mood: "in general it's fairly good"     Depression: phq 9 is negative    07/13/2023    3:07 PM 06/21/2022    8:07 AM 09/23/2020   10:03 AM 03/19/2020    1:30 PM 12/19/2019    1:32 PM  Depression screen PHQ 2/9  Decreased Interest 0 0 0 0 1  Down, Depressed, Hopeless 0 0 0 0 0  PHQ - 2 Score 0 0 0 0 1  Altered sleeping 0 0  0 0  Tired, decreased energy 0 0  0 0  Change in appetite 0 0  0 1  Feeling bad or failure about yourself  0 0  0 1  Trouble concentrating 0 0  0 0  Moving slowly or fidgety/restless 0 0  0 0  Suicidal thoughts 0 0  0 0  PHQ-9 Score 0 0  0 3  Difficult doing work/chores Not difficult at all Not difficult at all  Not difficult at all Not difficult at all    Hypertension:  BP Readings from Last 3 Encounters:  07/13/23 116/82  06/21/22 126/80  03/19/20 132/84    Obesity: Wt Readings from Last 3 Encounters:  07/13/23 217 lb 11.2 oz (98.7 kg)  06/21/22 183 lb 9.6 oz (83.3 kg)  03/19/20 204 lb 1.6 oz (92.6 kg)   BMI Readings from Last 3 Encounters:  07/13/23 29.53 kg/m  06/21/22 24.90 kg/m  03/19/20 27.68 kg/m     Lipids:  Lab Results  Component Value Date   CHOL 198 06/21/2022   CHOL 192 03/19/2020   CHOL 209 (H) 02/27/2018   Lab Results  Component Value Date   HDL 57  06/21/2022   HDL 41 03/19/2020   HDL 39 (L) 02/27/2018   Lab Results  Component Value Date   LDLCALC 123 (H) 06/21/2022   LDLCALC 136 (H) 03/19/2020   LDLCALC 151 (H) 02/27/2018   Lab Results  Component Value Date   TRIG 85 06/21/2022   TRIG 56 03/19/2020   TRIG 83 02/27/2018   Lab Results  Component Value Date   CHOLHDL 3.5 06/21/2022   CHOLHDL 4.7 03/19/2020   CHOLHDL 5.4 (H) 02/27/2018   No results found for: "LDLDIRECT" Glucose:  Glucose, Bld  Date Value Ref Range Status  06/21/2022 88 65 - 99 mg/dL Final    Comment:    .            Fasting reference interval .   03/19/2020 74 65 - 99 mg/dL Final    Comment:    .            Fasting reference interval .   02/27/2018 90 65 - 99 mg/dL Final  Comment:    .            Fasting reference interval .     Flowsheet Row Office Visit from 02/27/2018 in [facility]  AUDIT-C Score 1        Single STD testing and prevention (HIV/chl/gon/syphilis):  no, declines today  He is not currently sexually active  Skin cancer: Discussed monitoring for atypical lesions Prostate cancer screening:  NA No results found for: "PSA"  Health Maintenance  Topic Date Due   COVID-19 Vaccine (1 - 2023-24 season) 07/29/2023*   Flu Shot  12/04/2023*   DTaP/Tdap/Td vaccine (2 - Td or Tdap) 09/05/2028   Hepatitis C Screening  Completed   HIV Screening  Completed   HPV Vaccine  Aged Out  *Topic was postponed. The date shown is not the original due date.    Lung cancer:  Low Dose CT Chest recommended if Age 47-80 years, 30 pack-year currently smoking OR have quit w/in 15years. Patient  not applicable AAA: The USPSTF recommends one-time screening with ultrasonography in men ages 53 to 75 years who have ever smoked. Patient:  not applicable ECG:  NA   Colon cancer screening:  Unsure of family hx - grandfather passed from some kind of cancer in 63s  Recommend routine screening at age 75 for now    Advanced Care Planning: A voluntary discussion about advance care planning including the explanation and discussion of advance directives.  Discussed health care proxy and Living will, and the patient was able to identify a health care proxy as no one.  Patient does not have a living will in effect at this time.  Patient Active Problem List   Diagnosis Date Noted   Advanced care planning/counseling discussion 07/13/2023   COVID 09/23/2020   Class 1 obesity due to excess calories with serious comorbidity and body mass index (BMI) of 33.0 to 33.9 in adult 12/19/2019   Hyperlipidemia 02/28/2018   Low HDL (under 40) 02/28/2018   Preventative health care 02/27/2018   Cervical pain (neck) 03/25/2015   Chronic bilateral low back pain without sciatica 03/25/2015   Bilateral thoracic back pain 03/25/2015    Past Surgical History:  Procedure Laterality Date   KNEE SURGERY Left 2013    Family History  Problem Relation Age of Onset   Breast cancer Mother    Hypertension Mother    Colon cancer Maternal Aunt    Anuerysm Maternal Aunt    Diabetes Maternal Grandmother    Dementia Maternal Grandmother    Pancreatic cancer Maternal Grandfather    Urinary tract infection Paternal Grandfather     Social History   Socioeconomic History   Marital status: Single    Spouse name: Not on file   Number of children: Not on file   Years of education: Not on file   Highest education level: Not on file  Occupational History   Not on file  Tobacco Use   Smoking status: Never   Smokeless tobacco: Never  Vaping Use   Vaping status: Never Used  Substance and Sexual Activity   Alcohol use: Yes    Alcohol/week: 0.0 standard drinks of alcohol    Comment: rarely   Drug use: No   Sexual activity: Not Currently  Other Topics Concern   Not on file  Social History Narrative   Not on file   Social Determinants of Health   Financial Resource Strain: Low Risk  (06/21/2022)   Overall Financial  Resource Strain (  CARDIA)    Difficulty of Paying Living Expenses: Not very hard  Food Insecurity: No Food Insecurity (06/21/2022)   Hunger Vital Sign    Worried About Running Out of Food in the Last Year: Never true    Ran Out of Food in the Last Year: Never true  Transportation Needs: No Transportation Needs (06/21/2022)   PRAPARE - Administrator, Civil Service (Medical): No    Lack of Transportation (Non-Medical): No  Physical Activity: Sufficiently Active (06/21/2022)   Exercise Vital Sign    Days of Exercise per Week: 5 days    Minutes of Exercise per Session: 60 min  Stress: No Stress Concern Present (06/21/2022)   [employer] of Occupational Health - Occupational Stress Questionnaire    Feeling of Stress : Not at all  Social Connections: Moderately Isolated (06/21/2022)   Social Connection and Isolation Panel [NHANES]    Frequency of Communication with Friends and Family: Once a week    Frequency of Social Gatherings with Friends and Family: Twice a week    Attends Religious Services: 1 to 4 times per year    Active Member of [employer] or Organizations: No    Attends Banker Meetings: Never    Marital Status: Never married  Intimate Partner Violence: Not At Risk (06/21/2022)   Humiliation, Afraid, Rape, and Kick questionnaire    Fear of Current or Ex-Partner: No    Emotionally Abused: No    Physically Abused: No    Sexually Abused: No     Current Outpatient Medications:    finasteride (PROPECIA) 1 MG tablet, Take 1 mg by mouth daily., Disp: , Rfl:   No Known Allergies   Review of Systems  Constitutional:  Negative for chills, fever, malaise/fatigue and weight loss.  HENT:  Positive for nosebleeds (usually gets mild nosebleeds in colder weather). Negative for hearing loss, sore throat and tinnitus.   Eyes:  Negative for blurred vision, double vision and photophobia.  Respiratory:  Positive for wheezing (while doing yardwork). Negative for  cough and shortness of breath.   Cardiovascular:  Negative for chest pain, palpitations and leg swelling.  Gastrointestinal:  Negative for blood in stool, constipation, diarrhea, heartburn, nausea and vomiting.  Genitourinary:  Positive for frequency. Negative for dysuria.  Musculoskeletal:  Negative for falls, joint pain and myalgias.  Skin:  Negative for itching and rash.  Neurological:  Negative for dizziness, tingling, tremors, loss of consciousness, weakness and headaches.  Psychiatric/Behavioral:  Negative for depression, memory loss, substance abuse and suicidal ideas. The patient is not nervous/anxious and does not have insomnia.        Objective  Vitals:   07/13/23 1501  BP: 116/82  Pulse: 96  Resp: 18  Temp: 98.9 F (37.2 C)  SpO2: 95%  Weight: 217 lb 11.2 oz (98.7 kg)  Height: 6' (1.829 m)    Body mass index is 29.53 kg/m.  Physical Exam Vitals reviewed.  Constitutional:      General: He is awake.     Appearance: Normal appearance. He is well-developed and well-groomed.  HENT:     Head: Normocephalic and atraumatic.     Right Ear: Hearing, tympanic membrane and ear canal normal.     Left Ear: Hearing, tympanic membrane and ear canal normal.     Nose: Nose normal.     Mouth/Throat:     Lips: Pink.     Mouth: Mucous membranes are moist. No lacerations or oral lesions.  Pharynx: Oropharynx is clear. Uvula midline. No pharyngeal swelling, oropharyngeal exudate or posterior oropharyngeal erythema.  Eyes:     General: Lids are normal. Gaze aligned appropriately.     Extraocular Movements: Extraocular movements intact.     Right eye: Normal extraocular motion and no nystagmus.     Left eye: Normal extraocular motion and no nystagmus.     Conjunctiva/sclera: Conjunctivae normal.     Pupils: Pupils are equal, round, and reactive to light.  Neck:     Thyroid: No thyroid mass, thyromegaly or thyroid tenderness.     Trachea: Phonation normal.  Cardiovascular:      Rate and Rhythm: Normal rate and regular rhythm.     Pulses: Normal pulses.          Radial pulses are 2+ on the right side and 2+ on the left side.     Heart sounds: Normal heart sounds. No murmur heard.    No friction rub. No gallop.  Pulmonary:     Effort: Pulmonary effort is normal.     Breath sounds: Normal breath sounds. No decreased air movement. No decreased breath sounds, wheezing, rhonchi or rales.  Abdominal:     General: Abdomen is flat. Bowel sounds are normal.     Palpations: Abdomen is soft.     Tenderness: There is no abdominal tenderness.  Musculoskeletal:     Cervical back: Normal range of motion and neck supple.     Right lower leg: No edema.     Left lower leg: No edema.  Lymphadenopathy:     Head:     Right side of head: No submental, submandibular or preauricular adenopathy.     Left side of head: No submental, submandibular or preauricular adenopathy.     Cervical: No cervical adenopathy.     Right cervical: No superficial or posterior cervical adenopathy.    Left cervical: No superficial or posterior cervical adenopathy.     Upper Body:     Right upper body: No supraclavicular adenopathy.     Left upper body: No supraclavicular adenopathy.  Skin:    General: Skin is warm and dry.  Neurological:     General: No focal deficit present.     Mental Status: He is alert and oriented to person, place, and time. Mental status is at baseline.     GCS: GCS eye subscore is 4. GCS verbal subscore is 5. GCS motor subscore is 6.     Cranial Nerves: No cranial nerve deficit, dysarthria or facial asymmetry.     Motor: No weakness, tremor, atrophy or abnormal muscle tone.     Gait: Gait is intact.     Deep Tendon Reflexes:     Reflex Scores:      Patellar reflexes are 2+ on the right side and 2+ on the left side. Psychiatric:        Attention and Perception: Attention and perception normal.        Mood and Affect: Mood and affect normal.        Speech: Speech  normal.        Behavior: Behavior normal. Behavior is cooperative.        Thought Content: Thought content normal.        Cognition and Memory: Cognition normal.        Judgment: Judgment normal.      No results found for this or any previous visit (from the past 2160 hour(s)).   Fall Risk:    07/13/2023  3:07 PM 06/21/2022    8:07 AM 09/23/2020   10:02 AM 03/19/2020    1:30 PM 12/19/2019    1:33 PM  Fall Risk   Falls in the past year? 0 0 0 0 0  Number falls in past yr: 0 0 0 0 0  Injury with Fall? 0 0 0 0 0     Functional Status Survey: Is the patient deaf or have difficulty hearing?: No Does the patient have difficulty seeing, even when wearing glasses/contacts?: No Does the patient have difficulty concentrating, remembering, or making decisions?: No Does the patient have difficulty walking or climbing stairs?: No Does the patient have difficulty dressing or bathing?: No Does the patient have difficulty doing errands alone such as visiting a doctor's office or shopping?: No    Assessment & Plan  Problem List Items Addressed This Visit       Other   Advanced care planning/counseling discussion    A voluntary discussion about advance care planning including the explanation and discussion of advance directives was extensively discussed  with the patient for 5  minutes with patient and myself  present.  Explanation about the health care proxy and Living will was reviewed and packet with forms with explanation of how to fill them out was given.  During this discussion, the patient was able to identify a health care proxy as  no one currently and plans to fill out the paperwork required.  Patient was offered a separate Advance Care Planning visit for further assistance with forms.         Other Visit Diagnoses     Annual physical exam    -  Primary   Relevant Orders   TSH   Hemoglobin A1c   Lipid panel   CBC with Differential/Platelet   COMPLETE METABOLIC PANEL  WITH GFR       -Prostate cancer screening and PSA options (with potential risks and benefits of testing vs not testing) were discussed along with recent recs/guidelines. -USPSTF grade A and B recommendations reviewed with patient; age-appropriate recommendations, preventive care, screening tests, etc discussed and encouraged; healthy living encouraged; see AVS for patient education given to patient -Discussed importance of 150 minutes of physical activity weekly, eat two servings of fish weekly, eat one serving of tree nuts ( cashews, pistachios, pecans, almonds.Marland Kitchen) every other day, eat 6 servings of fruit/vegetables daily and drink plenty of water and avoid sweet beverages.  -Reviewed Health Maintenance: yes  Return in about 1 year (around 07/12/2024) for Annual Physical.   I, Bekim Werntz E Tresean Mattix, PA-C, have reviewed all documentation for this visit. The documentation on 07/13/23 for the exam, diagnosis, procedures, and orders are all accurate and complete.   Jacquelin Hawking, MHS, PA-C [facility]

## 2023-07-14 LAB — CBC WITH DIFFERENTIAL/PLATELET
Absolute Lymphocytes: 2068 {cells}/uL (ref 850–3900)
Absolute Monocytes: 347 {cells}/uL (ref 200–950)
Basophils Absolute: 28 {cells}/uL (ref 0–200)
Basophils Relative: 0.5 %
Eosinophils Absolute: 50 {cells}/uL (ref 15–500)
Eosinophils Relative: 0.9 %
HCT: 45.2 % (ref 38.5–50.0)
Hemoglobin: 15.4 g/dL (ref 13.2–17.1)
MCH: 30.3 pg (ref 27.0–33.0)
MCHC: 34.1 g/dL (ref 32.0–36.0)
MCV: 89 fL (ref 80.0–100.0)
MPV: 8.9 fL (ref 7.5–12.5)
Monocytes Relative: 6.3 %
Neutro Abs: 3009 {cells}/uL (ref 1500–7800)
Neutrophils Relative %: 54.7 %
Platelets: 270 10*3/uL (ref 140–400)
RBC: 5.08 10*6/uL (ref 4.20–5.80)
RDW: 12.1 % (ref 11.0–15.0)
Total Lymphocyte: 37.6 %
WBC: 5.5 10*3/uL (ref 3.8–10.8)

## 2023-07-14 LAB — HEMOGLOBIN A1C
Hgb A1c MFr Bld: 5 %{Hb} (ref <5.7)
Mean Plasma Glucose: 97 mg/dL
eAG (mmol/L): 5.4 mmol/L

## 2023-07-14 LAB — COMPLETE METABOLIC PANEL WITH GFR
AG Ratio: 2.3 (calc) (ref 1.0–2.5)
ALT: 19 U/L (ref 9–46)
AST: 15 U/L (ref 10–40)
Albumin: 4.5 g/dL (ref 3.6–5.1)
Alkaline phosphatase (APISO): 32 U/L — ABNORMAL LOW (ref 36–130)
BUN: 9 mg/dL (ref 7–25)
CO2: 29 mmol/L (ref 20–32)
Calcium: 9.3 mg/dL (ref 8.6–10.3)
Chloride: 101 mmol/L (ref 98–110)
Creat: 0.82 mg/dL (ref 0.60–1.26)
Globulin: 2 g/dL (ref 1.9–3.7)
Glucose, Bld: 91 mg/dL (ref 65–99)
Potassium: 3.9 mmol/L (ref 3.5–5.3)
Sodium: 138 mmol/L (ref 135–146)
Total Bilirubin: 0.5 mg/dL (ref 0.2–1.2)
Total Protein: 6.5 g/dL (ref 6.1–8.1)
eGFR: 116 mL/min/{1.73_m2} (ref >=60)

## 2023-07-14 LAB — LIPID PANEL
Cholesterol: 202 mg/dL — ABNORMAL HIGH (ref <200)
HDL: 51 mg/dL (ref >=40)
LDL Cholesterol (Calc): 134 mg/dL — ABNORMAL HIGH
Non-HDL Cholesterol (Calc): 151 mg/dL — ABNORMAL HIGH (ref <130)
Total CHOL/HDL Ratio: 4 (calc) (ref <5.0)
Triglycerides: 71 mg/dL (ref <150)

## 2023-07-14 LAB — TSH: TSH: 1.71 m[IU]/L (ref 0.40–4.50)

## 2023-07-14 NOTE — Progress Notes (Signed)
Your labs are back Your cholesterol is elevated and appears to be increased from previous labs.  I recommend a trial of diet and exercise to see if we can control this without starting medication.  This would include reducing saturated fats in your daily diet and increasing "good" fats such as those from olive oil, tree nuts, avocado, etc.  I also recommend increasing your weekly exercise to include at least 150 minutes of moderate intensity physical activity.  If these measures do not improve your cholesterol over the next 6 months I recommend starting a medication to manage your cholesterol Your electrolytes, liver and kidney function are overall in normal ranges Your thyroid testing is normal Your A1c is 5.0 which is normal Your CBC is overall normal, no signs of anemia Please let us know if you have questions or concerns

## 2024-07-15 ENCOUNTER — Encounter: Payer: BLUE CROSS/BLUE SHIELD | Admitting: Internal Medicine

## 2024-07-24 NOTE — Progress Notes (Unsigned)
 Name: David Foster   MRN: [id number]    DOB: [birth year]   Date:07/25/2024       Progress Note  Subjective  Chief Complaint  Chief Complaint  Patient presents with   Establish Care    HPI  Patient presents for annual CPE .  Discussed the use of AI scribe software for clinical note transcription with the patient, who gave verbal consent to proceed.  History of Present Illness David Foster is a 38 year old male who presents for an annual physical exam.  He experiences urinary flow issues, including the need to push to empty his bladder and a frequent sensation of incomplete bladder emptying. He urinates multiple times during the day and occasionally at night. He takes finasteride, initially prescribed for hair loss. His family history includes prostate issues, with his father experiencing frequent urination and his grandfather having died from prostate cancer.  He lost 70 pounds in 2020 through a restrictive diet but has regained about 30 pounds. He joined a gym in April 2025 and exercises six days a week, engaging in both strength and cardio workouts. He acknowledges that his diet is not as balanced as it could be.  He does not regularly receive vaccines, except for a tetanus shot in 2020. He is aware of the HPV vaccine but has not received it. He previously discussed his cholesterol levels, noting low HDL. He is fasting today for blood work, having only tasted a small amount of coffee.   Diet: Regular - working on diet  Exercise: 6 days 45 minutes - joined gym, doing both cardio and strength  Last Dental Exam: completed Last Eye Exam: will schedule  Depression: phq 9 is negative    07/25/2024    8:28 AM 07/13/2023    3:07 PM 06/21/2022    8:07 AM 09/23/2020   10:03 AM 03/19/2020    1:30 PM  Depression screen PHQ 2/9  Decreased Interest 0 0 0 0 0  Down, Depressed, Hopeless 0 0 0 0 0  PHQ - 2 Score 0 0 0 0 0  Altered sleeping  0 0  0  Tired, decreased energy  0 0  0   Change in appetite  0 0  0  Feeling bad or failure about yourself   0 0  0  Trouble concentrating  0 0  0  Moving slowly or fidgety/restless  0 0  0  Suicidal thoughts  0 0  0  PHQ-9 Score  0  0   0   Difficult doing work/chores  Not difficult at all Not difficult at all  Not difficult at all     Data saved with a previous flowsheet row definition    Hypertension:  BP Readings from Last 3 Encounters:  07/25/24 118/76  07/13/23 116/82  06/21/22 126/80    Obesity: Wt Readings from Last 3 Encounters:  07/25/24 225 lb 3.2 oz (102.2 kg)  07/13/23 217 lb 11.2 oz (98.7 kg)  06/21/22 183 lb 9.6 oz (83.3 kg)   BMI Readings from Last 3 Encounters:  07/25/24 30.54 kg/m  07/13/23 29.53 kg/m  06/21/22 24.90 kg/m     Flowsheet Row Office Visit from 07/25/2024 in [facility]  AUDIT-C Score 1    Single STD testing and prevention (HIV/chl/gon/syphilis):  no Hep C Screening: completed Skin cancer: Discussed monitoring for atypical lesions Colorectal cancer: NA, discussed starting screening at age 93  Prostate cancer:  no No results found for: PSA  Lung cancer:  Low Dose CT Chest recommended if Age 77-80 years, 30 pack-year currently smoking OR have quit w/in 15years. Patient  is not a candidate for screening   AAA: The USPSTF recommends one-time screening with ultrasonography in men ages 65 to 75 years who have ever smoked. Patient   is not a candidate for screening   Vaccines: reviewed with the patient. Discussed flu and HPV vaccines.   Advanced Care Planning: A voluntary discussion about advance care planning including the explanation and discussion of advance directives.  Discussed health care proxy and Living will, and the patient was able to identify a health care proxy as David Foster (mother).  Patient does not have a living will and power of attorney of health care   Patient Active Problem List   Diagnosis Date Noted   Advanced care  planning/counseling discussion 07/13/2023   COVID 09/23/2020   Class 1 obesity due to excess calories with serious comorbidity and body mass index (BMI) of 33.0 to 33.9 in adult 12/19/2019   Hyperlipidemia 02/28/2018   Low HDL (under 40) 02/28/2018   Preventative health care 02/27/2018   Cervical pain (neck) 03/25/2015   Chronic bilateral low back pain without sciatica 03/25/2015   Bilateral thoracic back pain 03/25/2015    Past Surgical History:  Procedure Laterality Date   KNEE SURGERY Left 2013    Family History  Problem Relation Age of Onset   Breast cancer Mother    Hypertension Mother    Colon cancer Maternal Aunt    Anuerysm Maternal Aunt    Diabetes Maternal Grandmother    Dementia Maternal Grandmother    Pancreatic cancer Maternal Grandfather    Urinary tract infection Paternal Grandfather     Social History   Socioeconomic History   Marital status: Single    Spouse name: Not on file   Number of children: Not on file   Years of education: Not on file   Highest education level: Associate degree: academic program  Occupational History   Not on file  Tobacco Use   Smoking status: Never   Smokeless tobacco: Never  Vaping Use   Vaping status: Never Used  Substance and Sexual Activity   Alcohol use: Yes    Alcohol/week: 0.0 standard drinks of alcohol    Comment: rarely   Drug use: No   Sexual activity: Not Currently  Other Topics Concern   Not on file  Social History Narrative   Not on file   Social Drivers of Health   Financial Resource Strain: Low Risk  (07/24/2024)   Overall Financial Resource Strain (CARDIA)    Difficulty of Paying Living Expenses: Not hard at all  Food Insecurity: No Food Insecurity (07/24/2024)   Hunger Vital Sign    Worried About Running Out of Food in the Last Year: Never true    Ran Out of Food in the Last Year: Never true  Transportation Needs: No Transportation Needs (07/24/2024)   PRAPARE - Scientist, Research (physical Sciences) (Medical): No    Lack of Transportation (Non-Medical): No  Physical Activity: Sufficiently Active (07/24/2024)   Exercise Vital Sign    Days of Exercise per Week: 6 days    Minutes of Exercise per Session: 40 min  Stress: No Stress Concern Present (07/24/2024)   [employer] of Occupational Health - Occupational Stress Questionnaire    Feeling of Stress: Not at all  Social Connections: Moderately Integrated (07/24/2024)   Social Connection and Isolation Panel  Frequency of Communication with Friends and Family: Twice a week    Frequency of Social Gatherings with Friends and Family: Twice a week    Attends Religious Services: More than 4 times per year    Active Member of [employer] or Organizations: Yes    Attends Engineer, Structural: More than 4 times per year    Marital Status: Never married  Intimate Partner Violence: Not At Risk (07/25/2024)   Humiliation, Afraid, Rape, and Kick questionnaire    Fear of Current or Ex-Partner: No    Emotionally Abused: No    Physically Abused: No    Sexually Abused: No     Current Outpatient Medications:    finasteride (PROPECIA) 1 MG tablet, Take 1 mg by mouth daily., Disp: , Rfl:   No Known Allergies   Review of Systems  Genitourinary:  Positive for frequency and urgency.     Objective  Vitals:   07/25/24 0827  BP: 118/76  Pulse: 84  Resp: 16  Temp: 98.4 F (36.9 C)  TempSrc: Oral  SpO2: 97%  Weight: 225 lb 3.2 oz (102.2 kg)  Height: 6' (1.829 m)    Body mass index is 30.54 kg/m.  Physical Exam Constitutional:      Appearance: Normal appearance.  HENT:     Head: Normocephalic and atraumatic.     Mouth/Throat:     Mouth: Mucous membranes are moist.     Pharynx: Oropharynx is clear.  Eyes:     Extraocular Movements: Extraocular movements intact.     Conjunctiva/sclera: Conjunctivae normal.     Pupils: Pupils are equal, round, and reactive to light.  Neck:     Comments: No  thyromegaly Cardiovascular:     Rate and Rhythm: Normal rate and regular rhythm.  Pulmonary:     Effort: Pulmonary effort is normal.     Breath sounds: Normal breath sounds.  Musculoskeletal:     Cervical back: No tenderness.     Right lower leg: No edema.     Left lower leg: No edema.  Lymphadenopathy:     Cervical: No cervical adenopathy.  Skin:    General: Skin is warm and dry.  Neurological:     General: No focal deficit present.     Mental Status: He is alert. Mental status is at baseline.  Psychiatric:        Mood and Affect: Mood normal.        Behavior: Behavior normal.      Last CBC Lab Results  Component Value Date   WBC 5.5 07/13/2023   HGB 15.4 07/13/2023   HCT 45.2 07/13/2023   MCV 89.0 07/13/2023   MCH 30.3 07/13/2023   RDW 12.1 07/13/2023   PLT 270 07/13/2023   Last metabolic panel Lab Results  Component Value Date   GLUCOSE 91 07/13/2023   NA 138 07/13/2023   K 3.9 07/13/2023   CL 101 07/13/2023   CO2 29 07/13/2023   BUN 9 07/13/2023   CREATININE 0.82 07/13/2023   EGFR 116 07/13/2023   CALCIUM 9.3 07/13/2023   PROT 6.5 07/13/2023   ALBUMIN 4.3 01/23/2017   LABGLOB 2.0 03/25/2015   AGRATIO 2.4 03/25/2015   BILITOT 0.5 07/13/2023   ALKPHOS 42 01/23/2017   AST 15 07/13/2023   ALT 19 07/13/2023   Last lipids Lab Results  Component Value Date   CHOL 202 (H) 07/13/2023   HDL 51 07/13/2023   LDLCALC 134 (H) 07/13/2023   TRIG 71 07/13/2023  CHOLHDL 4.0 07/13/2023   Last hemoglobin A1c Lab Results  Component Value Date   HGBA1C 5.0 07/13/2023   Last thyroid functions Lab Results  Component Value Date   TSH 1.71 07/13/2023   Last vitamin D No results found for: 25OHVITD2, 25OHVITD3, VD25OH Last vitamin B12 and Folate No results found for: VITAMINB12, FOLATE    Assessment & Plan  Assessment & Plan Adult Wellness Visit Slight weight increase likely due to muscle gain from exercise. Blood pressure normal. Discussed  nutrition, exercise benefits, and HPV vaccination for cancer prevention. - Ordered blood work. - Provided information on HPV vaccination. - Encouraged regular exercise and balanced diet.  Lower urinary tract symptoms Intermittent urinary symptoms with family history of prostate issues. Currently on finasteride. - Continue finasteride. - Ordered PSA test.  Screening for prostate cancer Screening due to family history and urinary symptoms. Discussed PSA test costs and insurance coverage. - Ordered PSA test. - Instructed to inquire about PSA test cost with lab tech.  Screening for lipoid disorders Previous cholesterol levels improved. Regular exercise expected to maintain or improve levels. No medication recommended currently. - Continue to monitor cholesterol levels annually. - Encouraged regular exercise.  - CBC w/Diff/Platelet - Comprehensive Metabolic Panel (CMET) - Lipid Profile - PSA  -Prostate cancer screening and PSA options (with potential risks and benefits of testing vs not testing) were discussed along with recent recs/guidelines. -USPSTF grade A and B recommendations reviewed with patient; age-appropriate recommendations, preventive care, screening tests, etc discussed and encouraged; healthy living encouraged; see AVS for patient education given to patient -Discussed importance of 150 minutes of physical activity weekly, eat two servings of fish weekly, eat one serving of tree nuts ( cashews, pistachios, pecans, almonds.SABRA) every other day, eat 6 servings of fruit/vegetables daily and drink plenty of water and avoid sweet beverages.  -Reviewed Health Maintenance: yes

## 2024-07-25 ENCOUNTER — Ambulatory Visit (INDEPENDENT_AMBULATORY_CARE_PROVIDER_SITE_OTHER): Admitting: Internal Medicine

## 2024-07-25 ENCOUNTER — Encounter: Payer: Self-pay | Admitting: Internal Medicine

## 2024-07-25 ENCOUNTER — Other Ambulatory Visit: Payer: Self-pay

## 2024-07-25 VITALS — BP 118/76 | HR 84 | Temp 98.4°F | Resp 16 | Ht 72.0 in | Wt 225.2 lb

## 2024-07-25 DIAGNOSIS — Z125 Encounter for screening for malignant neoplasm of prostate: Secondary | ICD-10-CM | POA: Diagnosis not present

## 2024-07-25 DIAGNOSIS — Z0000 Encounter for general adult medical examination without abnormal findings: Secondary | ICD-10-CM

## 2024-07-25 DIAGNOSIS — Z13220 Encounter for screening for lipoid disorders: Secondary | ICD-10-CM | POA: Diagnosis not present

## 2024-07-25 DIAGNOSIS — R339 Retention of urine, unspecified: Secondary | ICD-10-CM | POA: Diagnosis not present

## 2024-07-25 NOTE — Patient Instructions (Signed)
 HPV (Human Papillomavirus) Vaccine: What You Need to Know Many vaccine information statements are available in Spanish and other languages. See PromoAge.com.br. 1. Why get vaccinated? HPV (human papillomavirus) vaccine can prevent infection with some types of human papillomavirus. HPV infections can cause certain types of cancers, including: cervical, vaginal, and vulvar cancers in women penile cancer in men anal cancers in both men and women cancers of tonsils, base of tongue, and back of throat (oropharyngeal cancer) in both men and women HPV infections can also cause anogenital warts. HPV vaccine can prevent over 90% of cancers caused by HPV. HPV is spread through intimate skin-to-skin or sexual contact. HPV infections are so common that nearly all people will get at least one type of HPV at some time in their lives. Most HPV infections go away on their own within 2 years. But sometimes HPV infections will last longer and can cause cancers later in life. 2. HPV vaccine HPV vaccine is routinely recommended for adolescents at 79 or 39 years of age to ensure they are protected before they are exposed to the virus. HPV vaccine may be given beginning at age 25 years and vaccination is recommended for everyone through 39 years of age. HPV vaccine may be given to adults 27 through 39 years of age, based on discussions between the patient and health care provider. Most children who get the first dose before 66 years of age need 2 doses of HPV vaccine. People who get the first dose at or after 62 years of age and younger people with certain immunocompromising conditions need 3 doses. Your health care provider can give you more information. HPV vaccine may be given at the same time as other vaccines. 3. Talk with your health care provider Tell your vaccination provider if the person getting the vaccine: Has had an allergic reaction after a previous dose of HPV vaccine, or has any severe,  life-threatening allergies Is pregnant--HPV vaccine is not recommended until after pregnancy In some cases, your health care provider may decide to postpone HPV vaccination until a future visit. People with minor illnesses, such as a cold, may be vaccinated. People who are moderately or severely ill should usually wait until they recover before getting HPV vaccine. Your health care provider can give you more information. 4. Risks of a vaccine reaction Soreness, redness, or swelling where the shot is given can happen after HPV vaccination. Fever or headache can happen after HPV vaccination. People sometimes faint after medical procedures, including vaccination. Tell your provider if you feel dizzy or have vision changes or ringing in the ears. As with any medicine, there is a very remote chance of a vaccine causing a severe allergic reaction, other serious injury, or death. 5. What if there is a serious problem? An allergic reaction could occur after the vaccinated person leaves the clinic. If you see signs of a severe allergic reaction (hives, swelling of the face and throat, difficulty breathing, a fast heartbeat, dizziness, or weakness), call 9-1-1 and get the person to the nearest hospital. For other signs that concern you, call your health care provider. Adverse reactions should be reported to the Vaccine Adverse Event Reporting System (VAERS). Your health care provider will usually file this report, or you can do it yourself. Visit the VAERS website at www.vaers.LAgents.no or call 256-510-8455. VAERS is only for reporting reactions, and VAERS staff members do not give medical advice. 6. The National Vaccine Injury Compensation Program The [employer] Vaccine Injury Compensation Program (VICP) is a  federal program that was created to compensate people who may have been injured by certain vaccines. Claims regarding alleged injury or death due to vaccination have a time limit for filing, which may be as  short as two years. Visit the VICP website at SpiritualWord.at or call 931-622-2373 to learn about the program and about filing a claim. 7. How can I learn more? Ask your health care provider. Call your local or state health department. Visit the website of the Food and Drug Administration (FDA) for vaccine package inserts and additional information at FinderList.no. Contact the Centers for Disease Control and Prevention (CDC): Call 737-738-9397 (1-800-CDC-INFO) or Visit CDC's website at PicCapture.uy. Source: CDC Vaccine Information Statement HPV Vaccine (04/10/2020) This same material is available at FootballExhibition.com.br for no charge. This information is not intended to replace advice given to you by your health care provider. Make sure you discuss any questions you have with your health care provider. Document Revised: 12/07/2022 Document Reviewed: 09/12/2022 Elsevier Patient Education  2024 [employer].

## 2024-07-26 ENCOUNTER — Ambulatory Visit: Payer: Self-pay | Admitting: Internal Medicine

## 2024-07-26 LAB — COMPREHENSIVE METABOLIC PANEL WITH GFR
AG Ratio: 2 (calc) (ref 1.0–2.5)
ALT: 17 U/L (ref 9–46)
AST: 21 U/L (ref 10–40)
Albumin: 4.5 g/dL (ref 3.6–5.1)
Alkaline phosphatase (APISO): 38 U/L (ref 36–130)
BUN: 19 mg/dL (ref 7–25)
CO2: 32 mmol/L (ref 20–32)
Calcium: 9.6 mg/dL (ref 8.6–10.3)
Chloride: 102 mmol/L (ref 98–110)
Creat: 0.93 mg/dL (ref 0.60–1.26)
Globulin: 2.2 g/dL (ref 1.9–3.7)
Glucose, Bld: 92 mg/dL (ref 65–99)
Potassium: 5 mmol/L (ref 3.5–5.3)
Sodium: 139 mmol/L (ref 135–146)
Total Bilirubin: 0.7 mg/dL (ref 0.2–1.2)
Total Protein: 6.7 g/dL (ref 6.1–8.1)
eGFR: 108 mL/min/1.73m2 (ref >=60)

## 2024-07-26 LAB — CBC WITH DIFFERENTIAL/PLATELET
Absolute Lymphocytes: 1814 {cells}/uL (ref 850–3900)
Absolute Monocytes: 365 {cells}/uL (ref 200–950)
Basophils Absolute: 41 {cells}/uL (ref 0–200)
Basophils Relative: 0.9 %
Eosinophils Absolute: 50 {cells}/uL (ref 15–500)
Eosinophils Relative: 1.1 %
HCT: 48.8 % (ref 38.5–50.0)
Hemoglobin: 16.7 g/dL (ref 13.2–17.1)
MCH: 30.7 pg (ref 27.0–33.0)
MCHC: 34.2 g/dL (ref 32.0–36.0)
MCV: 89.7 fL (ref 80.0–100.0)
MPV: 9.3 fL (ref 7.5–12.5)
Monocytes Relative: 8.1 %
Neutro Abs: 2232 {cells}/uL (ref 1500–7800)
Neutrophils Relative %: 49.6 %
Platelets: 261 Thousand/uL (ref 140–400)
RBC: 5.44 Million/uL (ref 4.20–5.80)
RDW: 12.1 % (ref 11.0–15.0)
Total Lymphocyte: 40.3 %
WBC: 4.5 Thousand/uL (ref 3.8–10.8)

## 2024-07-26 LAB — PSA: PSA: 0.66 ng/mL (ref <=4.00)

## 2024-07-26 LAB — LIPID PANEL
Cholesterol: 194 mg/dL (ref <200)
HDL: 50 mg/dL (ref >=40)
LDL Cholesterol (Calc): 127 mg/dL — ABNORMAL HIGH
Non-HDL Cholesterol (Calc): 144 mg/dL — ABNORMAL HIGH (ref <130)
Total CHOL/HDL Ratio: 3.9 (calc) (ref <5.0)
Triglycerides: 73 mg/dL (ref <150)

## 2025-07-28 ENCOUNTER — Encounter: Admitting: Internal Medicine
